# Patient Record
Sex: Male | Born: 1965 | Race: White | Hispanic: No | Marital: Single | Smoking: Current every day smoker
Health system: Southern US, Community
[De-identification: ages and names within clinical notes are randomized; demographics above are authoritative.]

## PROBLEM LIST (undated history)

## (undated) DIAGNOSIS — M1990 Unspecified osteoarthritis, unspecified site: Secondary | ICD-10-CM

## (undated) DIAGNOSIS — N289 Disorder of kidney and ureter, unspecified: Secondary | ICD-10-CM

## (undated) DIAGNOSIS — G459 Transient cerebral ischemic attack, unspecified: Secondary | ICD-10-CM

## (undated) HISTORY — PX: SKIN GRAFT: SHX250

---

## 2013-02-09 ENCOUNTER — Encounter (HOSPITAL_COMMUNITY): Payer: Self-pay | Admitting: Emergency Medicine

## 2013-02-09 ENCOUNTER — Emergency Department (HOSPITAL_COMMUNITY)
Admission: EM | Admit: 2013-02-09 | Discharge: 2013-02-09 | Disposition: A | Discharge status: Home / Self Care | Payer: BC Managed Care – PPO | Attending: Emergency Medicine | Admitting: Emergency Medicine

## 2013-02-09 ENCOUNTER — Emergency Department (HOSPITAL_COMMUNITY): Payer: BC Managed Care – PPO

## 2013-02-09 DIAGNOSIS — Z87442 Personal history of urinary calculi: Secondary | ICD-10-CM | POA: Insufficient documentation

## 2013-02-09 DIAGNOSIS — F17200 Nicotine dependence, unspecified, uncomplicated: Secondary | ICD-10-CM | POA: Insufficient documentation

## 2013-02-09 DIAGNOSIS — R29810 Facial weakness: Secondary | ICD-10-CM

## 2013-02-09 DIAGNOSIS — Z8739 Personal history of other diseases of the musculoskeletal system and connective tissue: Secondary | ICD-10-CM | POA: Insufficient documentation

## 2013-02-09 DIAGNOSIS — H02409 Unspecified ptosis of unspecified eyelid: Secondary | ICD-10-CM | POA: Insufficient documentation

## 2013-02-09 DIAGNOSIS — Z791 Long term (current) use of non-steroidal anti-inflammatories (NSAID): Secondary | ICD-10-CM | POA: Insufficient documentation

## 2013-02-09 HISTORY — DX: Disorder of kidney and ureter, unspecified: N28.9

## 2013-02-09 HISTORY — DX: Unspecified osteoarthritis, unspecified site: M19.90

## 2013-02-09 LAB — CBC WITH DIFFERENTIAL/PLATELET
Basophils Absolute: 0.1 10*3/uL (ref 0.0–0.1)
Basophils Relative: 2 % — ABNORMAL HIGH (ref 0–1)
Eosinophils Absolute: 0.1 10*3/uL (ref 0.0–0.7)
Eosinophils Relative: 1 % (ref 0–5)
HEMATOCRIT: 39.4 % (ref 39.0–52.0)
HEMOGLOBIN: 14.1 g/dL (ref 13.0–17.0)
LYMPHS PCT: 35 % (ref 12–46)
Lymphs Abs: 1.8 10*3/uL (ref 0.7–4.0)
MCH: 34.3 pg — ABNORMAL HIGH (ref 26.0–34.0)
MCHC: 35.8 g/dL (ref 30.0–36.0)
MCV: 95.9 fL (ref 78.0–100.0)
MONO ABS: 0.6 10*3/uL (ref 0.1–1.0)
MONOS PCT: 11 % (ref 3–12)
Neutro Abs: 2.7 10*3/uL (ref 1.7–7.7)
Neutrophils Relative %: 51 % (ref 43–77)
Platelets: 143 10*3/uL — ABNORMAL LOW (ref 150–400)
RBC: 4.11 MIL/uL — ABNORMAL LOW (ref 4.22–5.81)
RDW: 13.9 % (ref 11.5–15.5)
WBC: 5.2 10*3/uL (ref 4.0–10.5)

## 2013-02-09 LAB — BASIC METABOLIC PANEL
BUN: 11 mg/dL (ref 6–23)
CO2: 25 meq/L (ref 19–32)
CREATININE: 1.13 mg/dL (ref 0.50–1.35)
Calcium: 8.8 mg/dL (ref 8.4–10.5)
Chloride: 103 mEq/L (ref 96–112)
GFR calc Af Amer: 87 mL/min — ABNORMAL LOW (ref >90)
GFR calc non Af Amer: 75 mL/min — ABNORMAL LOW (ref >90)
Glucose, Bld: 83 mg/dL (ref 70–99)
Potassium: 3.9 mEq/L (ref 3.7–5.3)
Sodium: 139 mEq/L (ref 137–147)

## 2013-02-09 LAB — SEDIMENTATION RATE: SED RATE: 4 mm/h (ref 0–16)

## 2013-02-09 LAB — PROTIME-INR
INR: 1.15 (ref 0.00–1.49)
PROTHROMBIN TIME: 14.5 s (ref 11.6–15.2)

## 2013-02-09 LAB — APTT: aPTT: 33 seconds (ref 24–37)

## 2013-02-09 MED ORDER — HYPROMELLOSE (GONIOSCOPIC) 2.5 % OP SOLN: 1.0000 [drp] | Freq: Three times a day (TID) | OPHTHALMIC | Status: DC | PRN

## 2013-02-09 MED ORDER — IBUPROFEN 800 MG PO TABS
800.0000 mg | ORAL_TABLET | Freq: Once | ORAL | Status: AC
  Administered 2013-02-09: 800 mg via ORAL
  Filled 2013-02-09: qty 1

## 2013-02-09 MED ORDER — PREDNISONE 10 MG PO TABS: 10.0000 mg | ORAL_TABLET | Freq: Every day | ORAL | Status: DC

## 2013-02-09 NOTE — Discharge Instructions (Signed)
You were seen and evaluated for weakness of your left face and eye irritation. Your lab testing and CAT scan of your head have not shown any signs for a concerning or emergent cause of your symptoms at this time. Please followup with a neurology specialist for continued evaluation and treatment. Please also followup with primary care provider for continued evaluation and treatment. Return anytime for changing or worsening symptoms.

## 2013-02-09 NOTE — ED Provider Notes (Signed)
CSN: [id number]     Arrival date & time 02/09/13  0019 History   First MD Initiated Contact with Patient 02/09/13 0050     Chief Complaint  Patient presents with  . Eye Problem   HPI  History provided by the patient and wife. Patient is a 48 year old male with no significant PMH who presents with concerns for swelling of the left face and left eye irritation. Patient and wife state that he has had symptoms for the past one month. They report a slight drooping and weakness with the swelling appearance to his left lower eyelid and cheek area. Patient has also had some intermittent pains around the left temple and head. Also reports a tingling sensation occasionally to the left face. Symptoms do not always coincide. He does have some left eye irritation at times. Denies any redness to the eye or tearing. There has been no vision changes. He denies any speech change or slurred speech. No confusion. No other weakness or numbness in extremities. Denies any history of hypertension hypercholesterolemia or diabetes. Patient is at everyday smoker.    Past Medical History  Diagnosis Date  . Renal disorder     kidney stones  . Arthritis     both knees   History reviewed. No pertinent past surgical history. History reviewed. No pertinent family history. History  Substance Use Topics  . Smoking status: Current Every Day Smoker  . Smokeless tobacco: Not on file  . Alcohol Use: Yes    Review of Systems  Constitutional: Negative for fever, chills and diaphoresis.  Respiratory: Negative for cough and shortness of breath.   Neurological: Positive for facial asymmetry, numbness and headaches. Negative for dizziness, speech difficulty, weakness and light-headedness.  Psychiatric/Behavioral: Negative for confusion.  All other systems reviewed and are negative.    Allergies  Review of patient's allergies indicates no known allergies.  Home Medications   Current Outpatient Rx  Name  Route  Sig   Dispense  Refill  . meloxicam (MOBIC) 7.5 MG tablet   Oral   Take 7.5 mg by mouth 2 (two) times daily.          BP 124/80  Pulse 74  Temp(Src) 98 F (36.7 C) (Oral)  Resp 16  SpO2 98% Physical Exam  Nursing note and vitals reviewed. Constitutional: He is oriented to person, place, and time. He appears well-developed and well-nourished. No distress.  HENT:  Head: Normocephalic and atraumatic.  Mouth/Throat: Oropharynx is clear and moist.  Normal dentition. No swelling of the gums.  Eyes: Conjunctivae and EOM are normal. Pupils are equal, round, and reactive to light.  Neck: Normal range of motion. Neck supple. Carotid bruit is not present.  Cardiovascular: Normal rate and regular rhythm.   Pulmonary/Chest: Effort normal and breath sounds normal. No respiratory distress. He has no wheezes. He has no rales.  Abdominal: Soft.  Musculoskeletal: Normal range of motion. He exhibits no edema and no tenderness.  Neurological: He is alert and oriented to person, place, and time. He has normal strength. A cranial nerve deficit is present. No sensory deficit. Gait normal.  There appears to be some weakness to the left face with more prominence of the crease to the cheek and nose area as well as some increased decreasing under the left eyelid. Facial muscles seem weakened when palpated and visualized during smile and eye closure. He is able to close eye completely. There is no weakness to the forehead.  Skin: No rash noted. No erythema.  Psychiatric: He has a normal mood and affect. His behavior is normal.    ED Course  Procedures   DIAGNOSTIC STUDIES: Oxygen Saturation is 98% on room air.    COORDINATION OF CARE:  Nursing notes reviewed. Vital signs reviewed. Initial pt interview and examination performed.   3:18 AM-patient seen and evaluated. He is well-appearing no acute distress. Skin appears normal to the left face without induration or swelling. There does seem to be slight  weakness. This does not clearly appear consistent with Bell's palsy. Patient is slightly hypertensive at triage. He is also every day smoker. Discussed work up plan with pt at bedside, which includes testing and a CT of the head and brain. Pt agrees with plan.  Lab testing and head CT appear normal without concerning findings. Patient was discussed and evaluated with attending physician. At this time we will consult neurology for any further recommendations.  Neurology recommended performing a sedimentation rate.  Sedimentation rate is normal. At this point we will give patient referral for neurology and treat as possible Bell's palsy with prednisone and ophthalmic eyedrops   Results for orders placed during the hospital encounter of 02/09/13  CBC WITH DIFFERENTIAL      Result Value Range   WBC 5.2  4.0 - 10.5 K/uL   RBC 4.11 (*) 4.22 - 5.81 MIL/uL   Hemoglobin 14.1  13.0 - 17.0 g/dL   HCT 16.1  09.6 - 04.5 %   MCV 95.9  78.0 - 100.0 fL   MCH 34.3 (*) 26.0 - 34.0 pg   MCHC 35.8  30.0 - 36.0 g/dL   RDW 40.9  81.1 - 91.4 %   Platelets 143 (*) 150 - 400 K/uL   Neutrophils Relative % 51  43 - 77 %   Neutro Abs 2.7  1.7 - 7.7 K/uL   Lymphocytes Relative 35  12 - 46 %   Lymphs Abs 1.8  0.7 - 4.0 K/uL   Monocytes Relative 11  3 - 12 %   Monocytes Absolute 0.6  0.1 - 1.0 K/uL   Eosinophils Relative 1  0 - 5 %   Eosinophils Absolute 0.1  0.0 - 0.7 K/uL   Basophils Relative 2 (*) 0 - 1 %   Basophils Absolute 0.1  0.0 - 0.1 K/uL  BASIC METABOLIC PANEL      Result Value Range   Sodium 139  137 - 147 mEq/L   Potassium 3.9  3.7 - 5.3 mEq/L   Chloride 103  96 - 112 mEq/L   CO2 25  19 - 32 mEq/L   Glucose, Bld 83  70 - 99 mg/dL   BUN 11  6 - 23 mg/dL   Creatinine, Ser 7.82  0.50 - 1.35 mg/dL   Calcium 8.8  8.4 - 95.6 mg/dL   GFR calc non Af Amer 75 (*) >90 mL/min   GFR calc Af Amer 87 (*) >90 mL/min  PROTIME-INR      Result Value Range   Prothrombin Time 14.5  11.6 - 15.2 seconds   INR  1.15  0.00 - 1.49  APTT      Result Value Range   aPTT 33  24 - 37 seconds      Imaging Review Ct Head Wo Contrast  02/09/2013   CLINICAL DATA:  Left eye droop, visual disturbance, facial weakness  EXAM: CT HEAD WITHOUT CONTRAST  TECHNIQUE: Contiguous axial images were obtained from the base of the skull through the vertex without contrast.  COMPARISON:  None  FINDINGS: Normal appearance of the intracranial structures. No evidence for acute hemorrhage, mass lesion, midline shift, hydrocephalus or large infarct. No acute bony abnormality. The visualized sinuses are clear.  IMPRESSION: No acute intracranial abnormality.   Electronically Signed   By: Ruel Favorsrevor  Shick M.D.   On: 02/09/2013 02:51      MDM   1. Facial weakness        Angus Sellereter S Prescilla Monger, PA-C 02/09/13 2253

## 2013-02-09 NOTE — ED Provider Notes (Signed)
4:04 AM PT evaluated and he is now asymptomatic.  He does have a picture on his phone that he shows me that does demonstrate some facial asymmetry. He reports these symptoms come and go. He occasionally gets sharp severe pain on that side as well. No ear pain or drainage.  Speech clear, cranial nerves II through XII intact. No tenderness over TMJ or temporal artery. No tenderness or swelling around his left ear.  Case discussed with neurology Dr. Thad Rangereynolds. She recommends sedimentation rate, consider vasculitis versus migraine.  Can followup with neurology as an outpatient.  CT/ labs reviewed as below.  Results for orders placed during the hospital encounter of 02/09/13  CBC WITH DIFFERENTIAL      Result Value Range   WBC 5.2  4.0 - 10.5 K/uL   RBC 4.11 (*) 4.22 - 5.81 MIL/uL   Hemoglobin 14.1  13.0 - 17.0 g/dL   HCT 40.939.4  81.139.0 - 91.452.0 %   MCV 95.9  78.0 - 100.0 fL   MCH 34.3 (*) 26.0 - 34.0 pg   MCHC 35.8  30.0 - 36.0 g/dL   RDW 78.213.9  95.611.5 - 21.315.5 %   Platelets 143 (*) 150 - 400 K/uL   Neutrophils Relative % 51  43 - 77 %   Neutro Abs 2.7  1.7 - 7.7 K/uL   Lymphocytes Relative 35  12 - 46 %   Lymphs Abs 1.8  0.7 - 4.0 K/uL   Monocytes Relative 11  3 - 12 %   Monocytes Absolute 0.6  0.1 - 1.0 K/uL   Eosinophils Relative 1  0 - 5 %   Eosinophils Absolute 0.1  0.0 - 0.7 K/uL   Basophils Relative 2 (*) 0 - 1 %   Basophils Absolute 0.1  0.0 - 0.1 K/uL  BASIC METABOLIC PANEL      Result Value Range   Sodium 139  137 - 147 mEq/L   Potassium 3.9  3.7 - 5.3 mEq/L   Chloride 103  96 - 112 mEq/L   CO2 25  19 - 32 mEq/L   Glucose, Bld 83  70 - 99 mg/dL   BUN 11  6 - 23 mg/dL   Creatinine, Ser 0.861.13  0.50 - 1.35 mg/dL   Calcium 8.8  8.4 - 57.810.5 mg/dL   GFR calc non Af Amer 75 (*) >90 mL/min   GFR calc Af Amer 87 (*) >90 mL/min  PROTIME-INR      Result Value Range   Prothrombin Time 14.5  11.6 - 15.2 seconds   INR 1.15  0.00 - 1.49  APTT      Result Value Range   aPTT 33  24 - 37  seconds  SEDIMENTATION RATE      Result Value Range   Sed Rate 4  0 - 16 mm/hr   Ct Head Wo Contrast  02/09/2013   CLINICAL DATA:  Left eye droop, visual disturbance, facial weakness  EXAM: CT HEAD WITHOUT CONTRAST  TECHNIQUE: Contiguous axial images were obtained from the base of the skull through the vertex without contrast.  COMPARISON:  None  FINDINGS: Normal appearance of the intracranial structures. No evidence for acute hemorrhage, mass lesion, midline shift, hydrocephalus or large infarct. No acute bony abnormality. The visualized sinuses are clear.  IMPRESSION: No acute intracranial abnormality.   Electronically Signed   By: Ruel Favorsrevor  Shick M.D.   On: 02/09/2013 02:51    Medical screening examination/treatment/procedure(s) were conducted as a shared visit with non-physician practitioner(s)  and myself.  I personally evaluated the patient during the encounter.    Sunnie Nielsen, MD 02/09/13 [phone]

## 2013-02-09 NOTE — ED Notes (Signed)
Pt states that his left eye has been having a droop a month ago. Pt denies vision loss but states that the droop is getting worse.

## 2013-02-09 NOTE — ED Provider Notes (Signed)
Medical screening examination/treatment/procedure(s) were conducted as a shared visit with non-physician practitioner(s) and myself.  I personally evaluated the patient during the encounter.    Steve NielsenBrian Kashius Dominic, MD 02/09/13 [phone]

## 2013-03-13 ENCOUNTER — Emergency Department (HOSPITAL_COMMUNITY): Payer: BC Managed Care – PPO

## 2013-03-13 ENCOUNTER — Encounter (HOSPITAL_COMMUNITY): Payer: Self-pay | Admitting: Emergency Medicine

## 2013-03-13 ENCOUNTER — Inpatient Hospital Stay (HOSPITAL_COMMUNITY)
Admission: EM | Admit: 2013-03-13 | Discharge: 2013-03-14 | DRG: 069 | Disposition: A | Discharge status: Home / Self Care | Payer: BC Managed Care – PPO | Attending: Family Medicine | Admitting: Family Medicine

## 2013-03-13 DIAGNOSIS — M1990 Unspecified osteoarthritis, unspecified site: Secondary | ICD-10-CM

## 2013-03-13 DIAGNOSIS — E441 Mild protein-calorie malnutrition: Secondary | ICD-10-CM | POA: Diagnosis present

## 2013-03-13 DIAGNOSIS — Z87442 Personal history of urinary calculi: Secondary | ICD-10-CM

## 2013-03-13 DIAGNOSIS — D696 Thrombocytopenia, unspecified: Secondary | ICD-10-CM

## 2013-03-13 DIAGNOSIS — Z006 Encounter for examination for normal comparison and control in clinical research program: Secondary | ICD-10-CM

## 2013-03-13 DIAGNOSIS — N289 Disorder of kidney and ureter, unspecified: Secondary | ICD-10-CM | POA: Diagnosis present

## 2013-03-13 DIAGNOSIS — F17200 Nicotine dependence, unspecified, uncomplicated: Secondary | ICD-10-CM | POA: Diagnosis present

## 2013-03-13 DIAGNOSIS — Z79899 Other long term (current) drug therapy: Secondary | ICD-10-CM

## 2013-03-13 DIAGNOSIS — I4949 Other premature depolarization: Secondary | ICD-10-CM | POA: Diagnosis present

## 2013-03-13 DIAGNOSIS — W19XXXA Unspecified fall, initial encounter: Secondary | ICD-10-CM | POA: Diagnosis present

## 2013-03-13 DIAGNOSIS — I639 Cerebral infarction, unspecified: Secondary | ICD-10-CM

## 2013-03-13 DIAGNOSIS — G459 Transient cerebral ischemic attack, unspecified: Principal | ICD-10-CM | POA: Diagnosis present

## 2013-03-13 DIAGNOSIS — R471 Dysarthria and anarthria: Secondary | ICD-10-CM | POA: Diagnosis present

## 2013-03-13 DIAGNOSIS — IMO0002 Reserved for concepts with insufficient information to code with codable children: Secondary | ICD-10-CM

## 2013-03-13 DIAGNOSIS — M1710 Unilateral primary osteoarthritis, unspecified knee: Secondary | ICD-10-CM | POA: Diagnosis present

## 2013-03-13 DIAGNOSIS — R42 Dizziness and giddiness: Secondary | ICD-10-CM

## 2013-03-13 LAB — CBC
HEMATOCRIT: 38.3 % — AB (ref 39.0–52.0)
HEMOGLOBIN: 13.4 g/dL (ref 13.0–17.0)
MCH: 33.8 pg (ref 26.0–34.0)
MCHC: 35 g/dL (ref 30.0–36.0)
MCV: 96.5 fL (ref 78.0–100.0)
Platelets: 126 10*3/uL — ABNORMAL LOW (ref 150–400)
RBC: 3.97 MIL/uL — ABNORMAL LOW (ref 4.22–5.81)
RDW: 14.3 % (ref 11.5–15.5)
WBC: 5.5 10*3/uL (ref 4.0–10.5)

## 2013-03-13 LAB — URINALYSIS, ROUTINE W REFLEX MICROSCOPIC
Bilirubin Urine: NEGATIVE
Glucose, UA: NEGATIVE mg/dL
Ketones, ur: NEGATIVE mg/dL
Nitrite: NEGATIVE
PH: 7 (ref 5.0–8.0)
Protein, ur: NEGATIVE mg/dL
Specific Gravity, Urine: 1.007 (ref 1.005–1.030)
Urobilinogen, UA: 0.2 mg/dL (ref 0.0–1.0)

## 2013-03-13 LAB — URINE MICROSCOPIC-ADD ON

## 2013-03-13 LAB — COMPREHENSIVE METABOLIC PANEL
ALT: 18 U/L (ref 0–53)
AST: 25 U/L (ref 0–37)
Albumin: 2.9 g/dL — ABNORMAL LOW (ref 3.5–5.2)
Alkaline Phosphatase: 78 U/L (ref 39–117)
BUN: 12 mg/dL (ref 6–23)
CO2: 24 mEq/L (ref 19–32)
CREATININE: 0.94 mg/dL (ref 0.50–1.35)
Calcium: 8.5 mg/dL (ref 8.4–10.5)
Chloride: 105 mEq/L (ref 96–112)
GFR calc Af Amer: >90 mL/min (ref >90)
GFR calc non Af Amer: >90 mL/min (ref >90)
Glucose, Bld: 137 mg/dL — ABNORMAL HIGH (ref 70–99)
Potassium: 3.7 mEq/L (ref 3.7–5.3)
Sodium: 138 mEq/L (ref 137–147)
TOTAL PROTEIN: 5.8 g/dL — AB (ref 6.0–8.3)
Total Bilirubin: 1.1 mg/dL (ref 0.3–1.2)

## 2013-03-13 LAB — DIFFERENTIAL
BASOS PCT: 2 % — AB (ref 0–1)
Basophils Absolute: 0.1 10*3/uL (ref 0.0–0.1)
Eosinophils Absolute: 0.1 10*3/uL (ref 0.0–0.7)
Eosinophils Relative: 1 % (ref 0–5)
Lymphocytes Relative: 30 % (ref 12–46)
Lymphs Abs: 1.6 10*3/uL (ref 0.7–4.0)
MONO ABS: 0.5 10*3/uL (ref 0.1–1.0)
MONOS PCT: 8 % (ref 3–12)
NEUTROS PCT: 60 % (ref 43–77)
Neutro Abs: 3.3 10*3/uL (ref 1.7–7.7)

## 2013-03-13 LAB — I-STAT CHEM 8, ED
BUN: 11 mg/dL (ref 6–23)
CALCIUM ION: 1.18 mmol/L (ref 1.12–1.23)
CHLORIDE: 105 meq/L (ref 96–112)
Creatinine, Ser: 1 mg/dL (ref 0.50–1.35)
Glucose, Bld: 136 mg/dL — ABNORMAL HIGH (ref 70–99)
HEMATOCRIT: 39 % (ref 39.0–52.0)
Hemoglobin: 13.3 g/dL (ref 13.0–17.0)
POTASSIUM: 3.6 meq/L — AB (ref 3.7–5.3)
Sodium: 141 mEq/L (ref 137–147)
TCO2: 23 mmol/L (ref 0–100)

## 2013-03-13 LAB — RAPID URINE DRUG SCREEN, HOSP PERFORMED
Amphetamines: NOT DETECTED
BENZODIAZEPINES: NOT DETECTED
Barbiturates: NOT DETECTED
Cocaine: NOT DETECTED
OPIATES: NOT DETECTED
Tetrahydrocannabinol: NOT DETECTED

## 2013-03-13 LAB — I-STAT TROPONIN, ED: TROPONIN I, POC: 0 ng/mL (ref 0.00–0.08)

## 2013-03-13 LAB — APTT: aPTT: 32 seconds (ref 24–37)

## 2013-03-13 LAB — GLUCOSE, CAPILLARY: GLUCOSE-CAPILLARY: 112 mg/dL — AB (ref 70–99)

## 2013-03-13 LAB — PROTIME-INR
INR: 1.2 (ref 0.00–1.49)
Prothrombin Time: 14.9 seconds (ref 11.6–15.2)

## 2013-03-13 LAB — ETHANOL: Alcohol, Ethyl (B): <11 mg/dL (ref 0–11)

## 2013-03-13 MED ORDER — TRAMADOL HCL 50 MG PO TABS
50.0000 mg | ORAL_TABLET | Freq: Four times a day (QID) | ORAL | Status: DC | PRN
  Administered 2013-03-13: 50 mg via ORAL
  Filled 2013-03-13: qty 1

## 2013-03-13 MED ORDER — SODIUM CHLORIDE 0.9 % IJ SOLN
3.0000 mL | Freq: Two times a day (BID) | INTRAMUSCULAR | Status: DC
Start: 2013-03-13 — End: 2013-03-14
  Administered 2013-03-14: 3 mL via INTRAVENOUS

## 2013-03-13 MED ORDER — STUDY - INVESTIGATIONAL DRUG SIMPLE RECORD
90.0000 mg | Freq: Two times a day (BID) | Status: DC
  Administered 2013-03-14: 90 mg via ORAL
  Filled 2013-03-13 (×2): qty 90

## 2013-03-13 MED ORDER — STUDY - INVESTIGATIONAL DRUG SIMPLE RECORD
180.0000 mg | Freq: Once | Status: AC
  Administered 2013-03-13: 180 mg via ORAL
  Filled 2013-03-13: qty 180

## 2013-03-13 MED ORDER — SODIUM CHLORIDE 0.9 % IJ SOLN
3.0000 mL | INTRAMUSCULAR | Status: DC | PRN
  Administered 2013-03-14 (×2): 3 mL via INTRAVENOUS

## 2013-03-13 MED ORDER — STUDY - INVESTIGATIONAL DRUG SIMPLE RECORD
300.0000 mg | Freq: Once | Status: AC
  Administered 2013-03-13: 300 mg via ORAL
  Filled 2013-03-13: qty 300

## 2013-03-13 MED ORDER — STUDY - INVESTIGATIONAL DRUG SIMPLE RECORD
100.0000 mg | Freq: Every day | Status: DC
  Administered 2013-03-14: 100 mg via ORAL
  Filled 2013-03-13 (×2): qty 100

## 2013-03-13 MED ORDER — SODIUM CHLORIDE 0.9 % IV SOLN: 250.0000 mL | INTRAVENOUS | Status: DC | PRN

## 2013-03-13 NOTE — ED Notes (Signed)
Per pt was at work this am and had sudden onset of dizziness. sts he had a fall at work because he was dizzy. Pt sts that the right side of his face feels funny and family sts he has been talking funny and stumbling.

## 2013-03-13 NOTE — ED Notes (Signed)
Patient in MRI now.

## 2013-03-13 NOTE — Consult Note (Signed)
Referring Physician: Deretha Emory    Chief Complaint: dysarthria (code stroke)  HPI:                                                                                                                                         Steve Cook is an 48 y.o. male who awoke at 4 am feeling fine.  His wife noted he mentioned he felt unstable on his feet around 6:30.  As the morning continued she noted his speech was slightly slurred.  At 7 AM he went home to change and felt very off balance to the point he fell.  He feels he fell to the right. Later he called his wife stating he had a slight HA and did not feel well. Patient was brought to ED as a code stroke due to his dysarthria. Currently he continues to have some dysarthria and states his left forehead feels more sensitive than his right but his right lower face also feels more sensitive than his left.   Of note: He was here in the hospital on the 29th of January.  At that time he was complaining of left eye pain and left eye swelling.  Neurology was spoken to at that time and it was recommended a SED rate be obtained.  This was and returned WNL.   Date last known well: Date: 03/13/2013 Time last known well: Time: 06:30 tPA Given: No: out of IV tPA window NIHSS: 3  Past Medical History  Diagnosis Date  . Renal disorder     kidney stones  . Arthritis     both knees    History reviewed. No pertinent past surgical history.  History reviewed. No pertinent family history. Social History:  reports that he has been smoking.  He does not have any smokeless tobacco history on file. He reports that he drinks alcohol. He reports that he does not use illicit drugs.  Allergies: No Known Allergies  Medications:                                                                                                                           No current facility-administered medications for this encounter.   Current Outpatient Prescriptions  Medication Sig Dispense  Refill  . hydroxypropyl methylcellulose (ISOPTO TEARS) 2.5 % ophthalmic solution Place 1 drop into the left eye 3 (three) times daily as needed  for dry eyes.  15 mL  12  . meloxicam (MOBIC) 7.5 MG tablet Take 7.5 mg by mouth 2 (two) times daily.      . predniSONE (DELTASONE) 10 MG tablet Take 1 tablet (10 mg total) by mouth daily. Take 6 tablets on day one, take 5 tablets on day 2, take 4 tabs on day 3, take 3 tabs on day 4, take 2 tabs on day 5, take one tablet on day 6  15 tablet  0     ROS:                                                                                                                                       History obtained from the patient  General ROS: negative for - chills, fatigue, fever, night sweats, weight gain or weight loss Psychological ROS: negative for - behavioral disorder, hallucinations, memory difficulties, mood swings or suicidal ideation Ophthalmic ROS: negative for - blurry vision, double vision, eye pain or loss of vision ENT ROS: negative for - epistaxis, nasal discharge, oral lesions, sore throat, tinnitus or vertigo Allergy and Immunology ROS: negative for - hives or itchy/watery eyes Hematological and Lymphatic ROS: negative for - bleeding problems, bruising or swollen lymph nodes Endocrine ROS: negative for - galactorrhea, hair pattern changes, polydipsia/polyuria or temperature intolerance Respiratory ROS: negative for - cough, hemoptysis, shortness of breath or wheezing Cardiovascular ROS: negative for - chest pain, dyspnea on exertion, edema or irregular heartbeat Gastrointestinal ROS: negative for - abdominal pain, diarrhea, hematemesis, nausea/vomiting or stool incontinence Genito-Urinary ROS: negative for - dysuria, hematuria, incontinence or urinary frequency/urgency Musculoskeletal ROS: negative for - joint swelling or muscular weakness Neurological ROS: as noted in HPI Dermatological ROS: negative for rash and skin lesion  changes  Neurologic Examination:                                                                                                      Blood pressure 156/71, pulse 83, temperature 98.3 F (36.8 C), resp. rate 18, SpO2 98.00%.   Mental Status: Alert, oriented, thought content appropriate.  Speech dysarthric without evidence of aphasia.  Able to follow 3 step commands without difficulty. Cranial Nerves: II: Discs flat bilaterally; Visual fields grossly normal, pupils equal, round, reactive to light and accommodation III,IV, VI: ptosis not present, extra-ocular motions intact bilaterally V,VII: smile symmetric, facial light touch sensation decreased on the right forehead and decreased on the left lower face.  VIII: hearing normal bilaterally IX,X:  gag reflex present XI: bilateral shoulder shrug XII: midline tongue extension without atrophy or fasciculations  Motor: Right : Upper extremity   5/5    Left:     Upper extremity   5/5  Lower extremity   5/5     Lower extremity   5/5 Tone and bulk:normal tone throughout; no atrophy noted Sensory: Pinprick and light touch intact throughout, bilaterally Deep Tendon Reflexes:  Right: Upper Extremity   Left: Upper extremity   biceps (C-5 to C-6) 2/4   biceps (C-5 to C-6) 2/4 tricep (C7) 2/4    triceps (C7) 2/4 Brachioradialis (C6) 2/4  Brachioradialis (C6) 2/4  Lower Extremity Lower Extremity  quadriceps (L-2 to L-4) 2/4   quadriceps (L-2 to L-4) 2/4 Achilles (S1) 2/4   Achilles (S1) 2/4  Plantars: Right: downgoing   Left: downgoing Cerebellar: normal finger-to-nose,  normal heel-to-shin test Gait: not tested due to multiple leads.  CV: pulses palpable throughout    Lab Results: Basic Metabolic Panel:  Recent Labs Lab 03/13/13 1242  NA 141  K 3.6*  CL 105  GLUCOSE 136*  BUN 11  CREATININE 1.00    Liver Function Tests: No results found for this basename: AST, ALT, ALKPHOS, BILITOT, PROT, ALBUMIN,  in the last 168 hours No  results found for this basename: LIPASE, AMYLASE,  in the last 168 hours No results found for this basename: AMMONIA,  in the last 168 hours  CBC:  Recent Labs Lab 03/13/13 1242  HGB 13.3  HCT 39.0    Cardiac Enzymes: No results found for this basename: CKTOTAL, CKMB, CKMBINDEX, TROPONINI,  in the last 168 hours  Lipid Panel: No results found for this basename: CHOL, TRIG, HDL, CHOLHDL, VLDL, LDLCALC,  in the last 168 hours  CBG: No results found for this basename: GLUCAP,  in the last 168 hours  Microbiology: No results found for this or any previous visit.  Coagulation Studies: No results found for this basename: LABPROT, INR,  in the last 72 hours  Imaging: No results found.     Assessment and plan discussed with with attending physician and they are in agreement.    Felicie MornDavid Smith PA-C [facility] [phone]  03/13/2013, 12:47 PM   Assessment: 48 y.o. male with new onset dysarthria and decreased facial sensation.  Patient is out of the window for tPA and NIHSS is 3 due to dysarthria and decreased facial sensation.  At this time cannot rule out posterior circulationTIA versus CVA.  Patient has gone to MRI at present time.    Stroke Risk Factors - none  Recommend--TIA workup    1. HgbA1c, fasting lipid panel 2. MRI, MRA  of the brain without contrast 3. PT consult, OT consult, Speech consult 4. Echocardiogram 5. Carotid dopplers 6. Prophylactic therapy-Antiplatelet med: Aspirin - dose 81 7. Risk  Factor modification   Patient seen and examined together with physician assistant and I concur with the assessment and plan.  Wyatt Portelasvaldo Camilo, MD

## 2013-03-13 NOTE — Progress Notes (Signed)
MD paged that patient has arrived on the unit.

## 2013-03-13 NOTE — H&P (Signed)
Family Medicine Teaching [facility] Admission History and Physical Service Pager: [phone]  Patient name: Steve Cook Medical record number: [id number] Date of birth: 1965/09/04 Age: 48 y.o. Gender: male  Primary Care Provider: No PCP Per Patient Consultants: Neurology Code Status: Full  Chief Complaint: Dizzy, fall  Assessment and Plan: Steve Cook is a 48 y.o. male presenting with dizziness and fall. PMH is significant for kidney stones, knee arthritis, history of dizzy spells and facial swelling.  # TIA / Dizziness: code stroke called in ED, seen by neuro and deemed not a candidate for TPA. CT head and MRI/MRA brain negative for acute stroke. EKG with occasional narrow PVCs. Lab workup: Cmet with low protein 5.8, albumin 2.9; CBC wnl, INR 1.20, normal ethanol, normal i-stat troponin, UA trace hemoglobin and leukocytes, negative UDS. Patient currently with normal speech (though fiancee does most of history), exam significant for decreased sensation of right face/arm/leg and stumbled to right while doing heel-to-toe.  - admit to telemetry, attending Dr. Mauricio Po - complete TIA workup including echo, carotid doppler, lipid panel, A1c - enrolled in stroke clinical trial in ED: patient to continue with aspirin vs ticagrelor for antiplatelet (Neuro was recommending 81mg  ASA) - neuro checks q4hrs - diet NPO pending bedside swallow eval, if passes, then heart healthy - PT/OT evals  # Bilateral knee arthritis: takes meloxicam twice a day. While enrolled in clinical trial will need to discontinue this. - stop meloxicam - tramadol bid PRN, if not tolerating then will restart meloxicam and trial will likely change / discontinue medications.  # Mild thrombocytopenia - PLT count 126; was 143 in January. No signs of bruising or bleeding. - Monitor  FEN/GI: NPO pending bedside swallow, saline lock IV Prophylaxis: SCDs  Disposition: admit to tele  History of Present Illness: Steve Cook  is a 48 y.o. male presenting with dizziness and fall this morning. This morning had sensation like he was "going to hit floor", and did fall today to the right but was able to catch himself; did not strike his head or hurt anything. He has never fallen before. Today is the worst his dizziness has ever been. [name] noted this morning that his voice sounded weak/muffled, did not notice any facial droop or swelling. At 7:15am got out of car and stumbled. At 9:45-10am called fiancee and said he felt drunk and fell. Also complained of headache and numbness/tingling of left hand earlier while in ED that has resolved. Denies weakness anywhere, changes in vision / hearing, or changes in speech.  Previously seen at ED about 1 month ago for left sided facial swelling (had CT head at that time).  Fiance thinks he has been having mini strokes: "lazy with speech", doesn't want to talk, voice is "draggy", gets tired. Fiance reports episodes that last a few hours since November. Patient reports that he has had dizzy spells for the past 10 years.  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 Social stressors include going through 30 year divorce, ex-wife aggravates him often.  Takes meloxicam twice a day for arthritis.  Review Of Systems: Per HPI with the following additions: denies CP, SOB, pain elsewhere.  Otherwise 12 point review of systems was performed and was unremarkable.  Patient Active Problem List   Diagnosis Date Noted  . TIA (transient ischemic attack) 03/13/2013   Past Medical History: Past Medical History  Diagnosis Date  . Renal disorder     kidney stones  . Arthritis  both knees   Past Surgical History: History reviewed. No pertinent past surgical history. Social History: History  Substance Use Topics  . Smoking status: Current Every Day Smoker  . Smokeless tobacco: Not on file  . Alcohol Use: Yes  Reports 1 peach schnopps drink every week or  two.  Additional social history: lives with fiance (and her 25yo son).  Please also refer to relevant sections of EMR.  Family History: History reviewed. No pertinent family history. Allergies and Medications: No Known Allergies No current facility-administered medications on file prior to encounter.   Current Outpatient Prescriptions on File Prior to Encounter  Medication Sig Dispense Refill  . meloxicam (MOBIC) 7.5 MG tablet Take 7.5 mg by mouth 2 (two) times daily.        Objective: BP 142/99  Pulse 86  Temp(Src) 97.8 F (36.6 C)  Resp 19  SpO2 100% Exam: General: NAD HEENT: PERRL, EOMI, o/p mildly erythematous but clear, sclera clear Cardiovascular: RRR, occasional skipped PVC, normal heart sounds. 2+ radial, DP, PT pulses bilaterally. Respiratory: CTAB, normal effort Abdomen: soft, NTND  Rectal exam: patient deferred Extremities: 2+ pitting edema bilaterally up to mid-shin.  Skin: warm and dry, well healed graft on left upper arm Neuro: alert and oriented. CN2-12 normal except for sensation--> Decreased sensation to light touch of right face and right UE (reports bilateral decreased sensation of legs after back injury). Strength 5/5 upper and lower extremities bilaterally. Reflexes 2+ biceps, patellar and achilles bilaterally. 1-2 beat clonus in feet bilaterally. Gait: slightly unsteady with normal appearance of walking; while doing heel to toe displays imbalance to right  Labs and Imaging: CBC BMET   Recent Labs Lab 03/13/13 1232 03/13/13 1242  WBC 5.5  --   HGB 13.4 13.3  HCT 38.3* 39.0  PLT 126*  --     Recent Labs Lab 03/13/13 1232 03/13/13 1242  NA 138 141  K 3.7 3.6*  CL 105 105  CO2 24  --   BUN 12 11  CREATININE 0.94 1.00  GLUCOSE 137* 136*  CALCIUM 8.5  --       Ct Head Wo Contrast  03/13/2013   CLINICAL DATA:  Code stroke, right-sided weakness starting 7 a.m. today  EXAM: CT HEAD WITHOUT CONTRAST  TECHNIQUE: Contiguous axial images were  obtained from the base of the skull through the vertex without intravenous contrast.  COMPARISON:  None.  FINDINGS: No mass lesion. No midline shift. No acute hemorrhage or hematoma. No extra-axial fluid collections. No evidence of acute infarction.   IMPRESSION: Negative head CT  Critical Value/emergent results were called by telephone at the time of interpretation on 03/13/2013 at 12:47 PM to Dr. Cyril Mourning , who verbally acknowledged these results.   Electronically Signed   By: Esperanza Heir M.D.   On: 03/13/2013 12:47   Mr Maxine Glenn Head Wo Contrast  03/13/2013   CLINICAL DATA:  Dysarthria and right-sided weakness. Evaluate for stroke.  EXAM: MRI HEAD WITHOUT CONTRAST  MRA HEAD WITHOUT CONTRAST  TECHNIQUE: Multiplanar, multiecho pulse sequences of the brain and surrounding structures were obtained without intravenous contrast. Angiographic images of the head were obtained using MRA technique without contrast.  COMPARISON:  Head CT 03/13/2013  FINDINGS: MRI HEAD FINDINGS  There is no evidence of acute infarct or intracranial hemorrhage. Ventricles and sulci are within normal limits for age. There is no evidence of mass, midline shift, or extra-axial fluid collection. Major intracranial vascular flow voids are preserved. Orbits are unremarkable. Paranasal sinuses and  mastoid air cells are clear.  MRA HEAD FINDINGS  The visualized distal vertebral arteries are patent with the left being minimally larger than the right. PICA origins are patent. AICAs are patent. SCA origins are patent. PCAs are unremarkable. There is a patent left posterior communicating artery. Internal carotid arteries are patent from skullbase to carotid termini. Carotid siphons are mildly ectatic, right more so than left. ACAs and MCAs are unremarkable. Anterior communicating artery is patent. No intracranial aneurysm.   IMPRESSION: 1. Unremarkable appearance of the brain.  No evidence of stroke. 2. Mildly ectatic carotid siphons, otherwise  unremarkable head MRA.   Electronically Signed   By: Sebastian AcheAllen  Grady   On: 03/13/2013 13:58   Mr Brain Wo Contrast  03/13/2013   CLINICAL DATA:  Dysarthria and right-sided weakness. Evaluate for stroke.  EXAM: MRI HEAD WITHOUT CONTRAST  MRA HEAD WITHOUT CONTRAST  TECHNIQUE: Multiplanar, multiecho pulse sequences of the brain and surrounding structures were obtained without intravenous contrast. Angiographic images of the head were obtained using MRA technique without contrast.  COMPARISON:  Head CT 03/13/2013  FINDINGS: MRI HEAD FINDINGS  There is no evidence of acute infarct or intracranial hemorrhage. Ventricles and sulci are within normal limits for age. There is no evidence of mass, midline shift, or extra-axial fluid collection. Major intracranial vascular flow voids are preserved. Orbits are unremarkable. Paranasal sinuses and mastoid air cells are clear.  MRA HEAD FINDINGS  The visualized distal vertebral arteries are patent with the left being minimally larger than the right. PICA origins are patent. AICAs are patent. SCA origins are patent. PCAs are unremarkable. There is a patent left posterior communicating artery. Internal carotid arteries are patent from skullbase to carotid termini. Carotid siphons are mildly ectatic, right more so than left. ACAs and MCAs are unremarkable. Anterior communicating artery is patent. No intracranial aneurysm.  IMPRESSION: 1. Unremarkable appearance of the brain.  No evidence of stroke. 2. Mildly ectatic carotid siphons, otherwise unremarkable head MRA.   Electronically Signed   By: Sebastian AcheAllen  Grady   On: 03/13/2013 13:58    Tawni CarnesAndrew Wight, MD 03/13/2013, 3:28 PM PGY-1, [facility] FPTS Intern pager: [phone], text pages welcome   I have seen patient with Dr Waynetta SandyWight and agree with his assessment and plan with my additions in blue.  Leona SingletonMaria T Nataniel Gasper, MD PGY-2, [facility]

## 2013-03-13 NOTE — ED Notes (Signed)
CBG 146 mg/DL

## 2013-03-13 NOTE — ED Provider Notes (Signed)
CSN: [id number]     Arrival date & time 03/13/13  1210 History   First MD Initiated Contact with Patient 03/13/13 1223     Chief Complaint  Patient presents with  . Code Stroke    An emergency department physician performed an initial assessment on this suspected stroke patient at 10. (Consider location/radiation/quality/duration/timing/severity/associated sxs/prior Treatment) The history is provided by the patient.   48 year old male who awoke at 4 AM feeling fine. At 6:30 things were abnormal. Patient felt unstable on his feet his wife noted that his speech was slightly slurred. At 7 AM he was changing and he felt very off balance to the point that he fell. He feels he fell to the right. Later he called his wife stating he had a slight headache and was not feeling well. Patient was brought to the ED as a code stroke due to the dysarthria. Patient continues to have some dysarthria states his left fore head feels more sensitive than his right.  Past Medical History  Diagnosis Date  . Renal disorder     kidney stones  . Arthritis     both knees   History reviewed. No pertinent past surgical history. History reviewed. No pertinent family history. History  Substance Use Topics  . Smoking status: Current Every Day Smoker  . Smokeless tobacco: Not on file  . Alcohol Use: Yes    Review of Systems  Constitutional: Negative for fever.  HENT: Positive for facial swelling. Negative for congestion, drooling, sore throat and trouble swallowing.   Eyes: Negative for visual disturbance.  Respiratory: Negative for shortness of breath.   Cardiovascular: Negative for chest pain.  Gastrointestinal: Negative for abdominal pain.  Genitourinary: Negative for dysuria.  Musculoskeletal: Negative for back pain.  Skin: Negative for rash.  Neurological: Positive for dizziness, speech difficulty, numbness and headaches. Negative for syncope.  Hematological: Does not bruise/bleed easily.   Psychiatric/Behavioral: Negative for confusion.      Allergies  Review of patient's allergies indicates no known allergies.  Home Medications   Current Outpatient Rx  Name  Route  Sig  Dispense  Refill  . hydroxypropyl methylcellulose (ISOPTO TEARS) 2.5 % ophthalmic solution   Left Eye   Place 1 drop into the left eye 3 (three) times daily as needed for dry eyes.   15 mL   12   . meloxicam (MOBIC) 7.5 MG tablet   Oral   Take 7.5 mg by mouth 2 (two) times daily.         . predniSONE (DELTASONE) 10 MG tablet   Oral   Take 1 tablet (10 mg total) by mouth daily. Take 6 tablets on day one, take 5 tablets on day 2, take 4 tabs on day 3, take 3 tabs on day 4, take 2 tabs on day 5, take one tablet on day 6   15 tablet   0    BP 131/79  Pulse 83  Temp(Src) 97.8 F (36.6 C)  Resp 17  SpO2 99% Physical Exam  Nursing note and vitals reviewed. Constitutional: He is oriented to person, place, and time. He appears well-developed and well-nourished. No distress.  HENT:  Head: Normocephalic and atraumatic.  Mouth/Throat: Oropharynx is clear and moist.  Eyes: Conjunctivae and EOM are normal. Pupils are equal, round, and reactive to light.  Neck: Normal range of motion.  Cardiovascular: Normal rate, regular rhythm and normal heart sounds.   No murmur heard. Pulmonary/Chest: Effort normal and breath sounds normal. No respiratory distress.  Abdominal: Soft. Bowel sounds are normal. There is no tenderness.  Musculoskeletal: Normal range of motion. He exhibits no edema.  Neurological: He is alert and oriented to person, place, and time. No cranial nerve deficit. He exhibits normal muscle tone. Coordination normal.  Normal except for the following. Speech is arthritic without evidence of aphasia. Able to follow 3 step commands without difficulty. Smile is symmetric facial light touch sensation decreased on the right for head decreased on the left lower face. Motor strength left and right  is normal. Sensory pinprick light touch is intact throughout bilaterally.  Skin: Skin is warm. No rash noted.    ED Course  Procedures (including critical care time) Labs Review Labs Reviewed  CBC - Abnormal; Notable for the following:    RBC 3.97 (*)    HCT 38.3 (*)    Platelets 126 (*)    All other components within normal limits  DIFFERENTIAL - Abnormal; Notable for the following:    Basophils Relative 2 (*)    All other components within normal limits  COMPREHENSIVE METABOLIC PANEL - Abnormal; Notable for the following:    Glucose, Bld 137 (*)    Total Protein 5.8 (*)    Albumin 2.9 (*)    All other components within normal limits  URINALYSIS, ROUTINE W REFLEX MICROSCOPIC - Abnormal; Notable for the following:    Hgb urine dipstick TRACE (*)    Leukocytes, UA TRACE (*)    All other components within normal limits  I-STAT CHEM 8, ED - Abnormal; Notable for the following:    Potassium 3.6 (*)    Glucose, Bld 136 (*)    All other components within normal limits  ETHANOL  PROTIME-INR  APTT  URINE RAPID DRUG SCREEN (HOSP PERFORMED)  URINE MICROSCOPIC-ADD ON  I-STAT TROPOININ, ED  I-STAT TROPOININ, ED   Results for orders placed during the hospital encounter of 03/13/13  ETHANOL      Result Value Ref Range   Alcohol, Ethyl (B) <11  0 - 11 mg/dL  PROTIME-INR      Result Value Ref Range   Prothrombin Time 14.9  11.6 - 15.2 seconds   INR 1.20  0.00 - 1.49  APTT      Result Value Ref Range   aPTT 32  24 - 37 seconds  CBC      Result Value Ref Range   WBC 5.5  4.0 - 10.5 K/uL   RBC 3.97 (*) 4.22 - 5.81 MIL/uL   Hemoglobin 13.4  13.0 - 17.0 g/dL   HCT 16.1 (*) 09.6 - 04.5 %   MCV 96.5  78.0 - 100.0 fL   MCH 33.8  26.0 - 34.0 pg   MCHC 35.0  30.0 - 36.0 g/dL   RDW 40.9  81.1 - 91.4 %   Platelets 126 (*) 150 - 400 K/uL  DIFFERENTIAL      Result Value Ref Range   Neutrophils Relative % 60  43 - 77 %   Neutro Abs 3.3  1.7 - 7.7 K/uL   Lymphocytes Relative 30  12 -  46 %   Lymphs Abs 1.6  0.7 - 4.0 K/uL   Monocytes Relative 8  3 - 12 %   Monocytes Absolute 0.5  0.1 - 1.0 K/uL   Eosinophils Relative 1  0 - 5 %   Eosinophils Absolute 0.1  0.0 - 0.7 K/uL   Basophils Relative 2 (*) 0 - 1 %   Basophils Absolute 0.1  0.0 -  0.1 K/uL  COMPREHENSIVE METABOLIC PANEL      Result Value Ref Range   Sodium 138  137 - 147 mEq/L   Potassium 3.7  3.7 - 5.3 mEq/L   Chloride 105  96 - 112 mEq/L   CO2 24  19 - 32 mEq/L   Glucose, Bld 137 (*) 70 - 99 mg/dL   BUN 12  6 - 23 mg/dL   Creatinine, Ser 4.09  0.50 - 1.35 mg/dL   Calcium 8.5  8.4 - 81.1 mg/dL   Total Protein 5.8 (*) 6.0 - 8.3 g/dL   Albumin 2.9 (*) 3.5 - 5.2 g/dL   AST 25  0 - 37 U/L   ALT 18  0 - 53 U/L   Alkaline Phosphatase 78  39 - 117 U/L   Total Bilirubin 1.1  0.3 - 1.2 mg/dL   GFR calc non Af Amer >90  >90 mL/min   GFR calc Af Amer >90  >90 mL/min  URINE RAPID DRUG SCREEN (HOSP PERFORMED)      Result Value Ref Range   Opiates NONE DETECTED  NONE DETECTED   Cocaine NONE DETECTED  NONE DETECTED   Benzodiazepines NONE DETECTED  NONE DETECTED   Amphetamines NONE DETECTED  NONE DETECTED   Tetrahydrocannabinol NONE DETECTED  NONE DETECTED   Barbiturates NONE DETECTED  NONE DETECTED  URINALYSIS, ROUTINE W REFLEX MICROSCOPIC      Result Value Ref Range   Color, Urine YELLOW  YELLOW   APPearance CLEAR  CLEAR   Specific Gravity, Urine 1.007  1.005 - 1.030   pH 7.0  5.0 - 8.0   Glucose, UA NEGATIVE  NEGATIVE mg/dL   Hgb urine dipstick TRACE (*) NEGATIVE   Bilirubin Urine NEGATIVE  NEGATIVE   Ketones, ur NEGATIVE  NEGATIVE mg/dL   Protein, ur NEGATIVE  NEGATIVE mg/dL   Urobilinogen, UA 0.2  0.0 - 1.0 mg/dL   Nitrite NEGATIVE  NEGATIVE   Leukocytes, UA TRACE (*) NEGATIVE  URINE MICROSCOPIC-ADD ON      Result Value Ref Range   Squamous Epithelial / LPF RARE  RARE   WBC, UA 0-2  <3 WBC/hpf   RBC / HPF 0-2  <3 RBC/hpf   Bacteria, UA RARE  RARE  I-STAT CHEM 8, ED      Result Value Ref Range    Sodium 141  137 - 147 mEq/L   Potassium 3.6 (*) 3.7 - 5.3 mEq/L   Chloride 105  96 - 112 mEq/L   BUN 11  6 - 23 mg/dL   Creatinine, Ser 9.14  0.50 - 1.35 mg/dL   Glucose, Bld 782 (*) 70 - 99 mg/dL   Calcium, Ion 9.56  2.13 - 1.23 mmol/L   TCO2 23  0 - 100 mmol/L   Hemoglobin 13.3  13.0 - 17.0 g/dL   HCT 08.6  57.8 - 46.9 %  I-STAT TROPOININ, ED      Result Value Ref Range   Troponin i, poc 0.00  0.00 - 0.08 ng/mL   Comment 3             Imaging Review Ct Head Wo Contrast  03/13/2013   CLINICAL DATA:  Code stroke, right-sided weakness starting 7 a.m. today  EXAM: CT HEAD WITHOUT CONTRAST  TECHNIQUE: Contiguous axial images were obtained from the base of the skull through the vertex without intravenous contrast.  COMPARISON:  None.  FINDINGS: No mass lesion. No midline shift. No acute hemorrhage or hematoma. No extra-axial fluid collections.  No evidence of acute infarction.  IMPRESSION: Negative head CT  Critical Value/emergent results were called by telephone at the time of interpretation on 03/13/2013 at 12:47 PM to Dr. Cyril Mourningamillo , who verbally acknowledged these results.   Electronically Signed   By: Esperanza Heiraymond  Rubner M.D.   On: 03/13/2013 12:47   Mr Maxine GlennMra Head Wo Contrast  03/13/2013   CLINICAL DATA:  Dysarthria and right-sided weakness. Evaluate for stroke.  EXAM: MRI HEAD WITHOUT CONTRAST  MRA HEAD WITHOUT CONTRAST  TECHNIQUE: Multiplanar, multiecho pulse sequences of the brain and surrounding structures were obtained without intravenous contrast. Angiographic images of the head were obtained using MRA technique without contrast.  COMPARISON:  Head CT 03/13/2013  FINDINGS: MRI HEAD FINDINGS  There is no evidence of acute infarct or intracranial hemorrhage. Ventricles and sulci are within normal limits for age. There is no evidence of mass, midline shift, or extra-axial fluid collection. Major intracranial vascular flow voids are preserved. Orbits are unremarkable. Paranasal sinuses and mastoid air  cells are clear.  MRA HEAD FINDINGS  The visualized distal vertebral arteries are patent with the left being minimally larger than the right. PICA origins are patent. AICAs are patent. SCA origins are patent. PCAs are unremarkable. There is a patent left posterior communicating artery. Internal carotid arteries are patent from skullbase to carotid termini. Carotid siphons are mildly ectatic, right more so than left. ACAs and MCAs are unremarkable. Anterior communicating artery is patent. No intracranial aneurysm.  IMPRESSION: 1. Unremarkable appearance of the brain.  No evidence of stroke. 2. Mildly ectatic carotid siphons, otherwise unremarkable head MRA.   Electronically Signed   By: Sebastian AcheAllen  Grady   On: 03/13/2013 13:58   Mr Brain Wo Contrast  03/13/2013   CLINICAL DATA:  Dysarthria and right-sided weakness. Evaluate for stroke.  EXAM: MRI HEAD WITHOUT CONTRAST  MRA HEAD WITHOUT CONTRAST  TECHNIQUE: Multiplanar, multiecho pulse sequences of the brain and surrounding structures were obtained without intravenous contrast. Angiographic images of the head were obtained using MRA technique without contrast.  COMPARISON:  Head CT 03/13/2013  FINDINGS: MRI HEAD FINDINGS  There is no evidence of acute infarct or intracranial hemorrhage. Ventricles and sulci are within normal limits for age. There is no evidence of mass, midline shift, or extra-axial fluid collection. Major intracranial vascular flow voids are preserved. Orbits are unremarkable. Paranasal sinuses and mastoid air cells are clear.  MRA HEAD FINDINGS  The visualized distal vertebral arteries are patent with the left being minimally larger than the right. PICA origins are patent. AICAs are patent. SCA origins are patent. PCAs are unremarkable. There is a patent left posterior communicating artery. Internal carotid arteries are patent from skullbase to carotid termini. Carotid siphons are mildly ectatic, right more so than left. ACAs and MCAs are unremarkable.  Anterior communicating artery is patent. No intracranial aneurysm.  IMPRESSION: 1. Unremarkable appearance of the brain.  No evidence of stroke. 2. Mildly ectatic carotid siphons, otherwise unremarkable head MRA.   Electronically Signed   By: Sebastian AcheAllen  Grady   On: 03/13/2013 13:58     EKG Interpretation   Date/Time:  Monday March 13 2013 12:47:07 EST Ventricular Rate:  87 PR Interval:  147 QRS Duration: 101 QT Interval:  395 QTC Calculation: 475 R Axis:   30 Text Interpretation:  Sinus rhythm Ventricular premature complex RSR' in  V1 or V2, right VCD or RVH Borderline prolonged QT interval No previous  ECGs available Confirmed by Danese Dorsainvil  MD, Symiah Nowotny [phone] on 03/13/2013  1:08:36 PM       CRITICAL CARE Performed by: Shelda Jakes. Total critical care time: 30 Critical care time was exclusive of separately billable procedures and treating other patients. Critical care was necessary to treat or prevent imminent or life-threatening deterioration. Critical care was time spent personally by me on the following activities: development of treatment plan with patient and/or surrogate as well as nursing, discussions with consultants, evaluation of patient's response to treatment, examination of patient, obtaining history from patient or surrogate, ordering and performing treatments and interventions, ordering and review of laboratory studies, ordering and review of radiographic studies, pulse oximetry and re-evaluation of patient's condition.    MDM   Final diagnoses:  CVA (cerebral infarction)    Patient presented as a code stroke. Was taken immediately to CT after medical screening [place]. Evaluated by me and the stroke team. Patient symptoms seem to be consistent with a stroke but had been present since 6:30 in the morning so not a candidate for TPA. Symptoms seem to be improving. MRI/MRA was negative for any acute findings this may represent more TIA component. Neurology will  continue to follow patient will be admitted by the family practice service. 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 Patient will be admitted by the family medicine service.  NIH stroke scale was 3 suspect symptoms started 6:30 in the morning patient not a candidate for TPA neurology may consider one of the protocol treatments.   Shelda Jakes, MD 03/13/13 [phone]

## 2013-03-13 NOTE — H&P (Signed)
FMTS Attending Admit Note Patient seen and examined by me in ED, discussed with resident team and I agree with Dr Lucienne Minkshekkekandam's assessment and plan.  Patient presenting initially with dysarthria and a fall today at work associated with dizziness.  His dysarthria has resolved completely at the time of my exam, as reported by patient and fiance. He denies any pain or discomfort at time of exam other than a mild right-sided headache.  Initial report of MRI/MRA brain is not suggestive of acute stroke.  Plan for admission for continuation of stroke/TIA workup.  Patient reports other, less-intense episodes of similar dizziness and dysarthria in the past year.  Discussed smoking cessation as part of stroke prevention.  Will need to establish primary care upon discharge as well (he does not have a doctor whom he sees regularly).  Appreciate Neurology consult.  Paula ComptonJames Tris Howell, MD

## 2013-03-13 NOTE — Progress Notes (Signed)
Patient was admitted to the hospital for possible TIA. During evaluation, patient was approached by the research staff about the clinical trial Socrates. The primary objective of the study is to compare the effect of 90-day treatment with ticagrelor (180 mg [two 90 mg tablets] loading dose on Day 1 followed by 90 mg twice daily maintenance dose for the remainder of the study) vs acetylsalicylic acid (ASA)-aspirin (300 mg [three 100 mg tablets] loading dose on Day 1 followed by 100 mg once daily maintenance dose for the remainder of the study) for the prevention of major vascular events (composite of stroke, myocardial infarction [MI], and death) in patients with acute ischaemic stroke or transient ischaemic attack (TIA). Patient was given the informed consent to read. The opportunity to ask questions was given. Patient read the Socrates informed consent . No questions were asked by patient. Signatures were obtain and a copy of the signed informed consent was given to patient. Patient meet the inclusion/exclusion criteria and randomized into the trial. Patient was given study drug at 16:02. [facility] for any trial details.

## 2013-03-13 NOTE — Code Documentation (Signed)
Code stroke called at 1222 [facility].  AS per patient and family, LSN 0630, when wife was driving patient to work, he laid back in the seat and stated he did not feel well and was dizzy.  He then stumbled a bit getting out of the car.  He then called his wife and stated he needed to go to the ER.  He states he feels dizzy, and his right cheek feels funny.   NIHSS 3  Patient arrived in MRI at 1254

## 2013-03-14 DIAGNOSIS — R29810 Facial weakness: Secondary | ICD-10-CM

## 2013-03-14 DIAGNOSIS — M1990 Unspecified osteoarthritis, unspecified site: Secondary | ICD-10-CM

## 2013-03-14 DIAGNOSIS — I059 Rheumatic mitral valve disease, unspecified: Secondary | ICD-10-CM

## 2013-03-14 DIAGNOSIS — G459 Transient cerebral ischemic attack, unspecified: Secondary | ICD-10-CM

## 2013-03-14 DIAGNOSIS — M129 Arthropathy, unspecified: Secondary | ICD-10-CM

## 2013-03-14 LAB — LIPID PANEL
CHOLESTEROL: 118 mg/dL (ref 0–200)
HDL: 65 mg/dL (ref >39)
LDL Cholesterol: 48 mg/dL (ref 0–99)
TRIGLYCERIDES: 23 mg/dL (ref <150)
Total CHOL/HDL Ratio: 1.8 RATIO
VLDL: 5 mg/dL (ref 0–40)

## 2013-03-14 LAB — GLUCOSE, CAPILLARY
Glucose-Capillary: 85 mg/dL (ref 70–99)
Glucose-Capillary: 74 mg/dL (ref 70–99)

## 2013-03-14 LAB — CBC
HEMATOCRIT: 36.7 % — AB (ref 39.0–52.0)
Hemoglobin: 13 g/dL (ref 13.0–17.0)
MCH: 34.3 pg — AB (ref 26.0–34.0)
MCHC: 35.4 g/dL (ref 30.0–36.0)
MCV: 96.8 fL (ref 78.0–100.0)
Platelets: 121 10*3/uL — ABNORMAL LOW (ref 150–400)
RBC: 3.79 MIL/uL — ABNORMAL LOW (ref 4.22–5.81)
RDW: 14.5 % (ref 11.5–15.5)
WBC: 5.3 10*3/uL (ref 4.0–10.5)

## 2013-03-14 LAB — HEMOGLOBIN A1C
HEMOGLOBIN A1C: 4.7 % (ref <5.7)
MEAN PLASMA GLUCOSE: 88 mg/dL (ref <117)

## 2013-03-14 MED ORDER — ATORVASTATIN CALCIUM 20 MG PO TABS
20.0000 mg | ORAL_TABLET | Freq: Every day | ORAL | Status: DC
  Filled 2013-03-14: qty 1

## 2013-03-14 MED ORDER — TRAMADOL HCL 50 MG PO TABS: 50.0000 mg | ORAL_TABLET | Freq: Four times a day (QID) | ORAL | Status: DC | PRN

## 2013-03-14 MED ORDER — TRAMADOL HCL 50 MG PO TABS: 50.0000 mg | ORAL_TABLET | Freq: Two times a day (BID) | ORAL | Status: AC | PRN

## 2013-03-14 MED ORDER — ATORVASTATIN CALCIUM 20 MG PO TABS: 20.0000 mg | ORAL_TABLET | Freq: Every day | ORAL | Status: AC

## 2013-03-14 NOTE — Discharge Summary (Signed)
[facility]  Patient name: Steve Cook Medical record number: [id number] Date of birth: Aug 03, 1965 Age: 48 y.o. Gender: male Date of Admission: 03/13/2013  Date of Discharge: 03/14/2013 Admitting Physician: Barbaraann BarthelJames O Breen, MD  Primary Care Provider: No PCP Per Patient Consultants: Neurology  Indication for Hospitalization: Dizzy, fall  Discharge Diagnoses/Problem List:  Transient ischemic attack Osteoarthritis of knees (bilateral) Tobacco abuse History of nephrolithiasis  Disposition: home  Discharge Condition: stable, improved  Brief Hospital Course:  Steve Cook is a 48 y.o. male that was admitted for dizziness and fall concerning for TIA. Neurology was consulted in the ED and deemed not a candidate for TPA. Exam concerning for decreased sensation on the right face, arm, leg and unsteady gait with heel-to-toe. Initial workup included CT, MRI, and MRA of the brain that were all normal; Cmet significant for low protein (5.8) and albumin (2.9), CBC with low platelets (126), negative ethanol, UDS and UA. TIA workup done and included A1c 4.7, lipid panel (HDL 65, LDL 48). By the next morning his symptoms were completely resolved with a normal exam and gait. Carotid duplex and echocardiogram were normal (EF 50-55%). While in the hospital he was enrolled in the Socrates trial and received either aspirin or ticagrelor for antiplatelet therapy to be continued on discharge.   Issues for Follow Up:  1. Neck/facial swelling: not seen by medical team and previously evaluated in ED, concern for possible angioedema. 2. Establish PCP: [facility] and establish care. 3. Mildly malnourished: low protein and albumin, per fiance was skipping meals to lose weight. Discussed with patient that skipping meals is an unhealthy way to lose weight, can temporarily use Ensure for several weeks to help in addition to not skipping  meals.  Significant Procedures: none  Significant Labs and Imaging:   Recent Labs Lab 03/13/13 1232 03/13/13 1242 03/14/13 0446  WBC 5.5  --  5.3  HGB 13.4 13.3 13.0  HCT 38.3* 39.0 36.7*  PLT 126*  --  121*    Recent Labs Lab 03/13/13 1232 03/13/13 1242  NA 138 141  K 3.7 3.6*  CL 105 105  CO2 24  --   GLUCOSE 137* 136*  BUN 12 11  CREATININE 0.94 1.00  CALCIUM 8.5  --   ALKPHOS 78  --   AST 25  --   ALT 18  --   ALBUMIN 2.9*  --    Lipid Panel     Component Value Date/Time   CHOL 118 03/14/2013 0446   TRIG 23 03/14/2013 0446   HDL 65 03/14/2013 0446   CHOLHDL 1.8 03/14/2013 0446   VLDL 5 03/14/2013 0446   LDLCALC 48 03/14/2013 0446   UDS negative UA trace hemoglobin, trace leukocytes Hemoglobin A1c  Results/Tests Pending at Time of Discharge: none  Discharge Medications:    Medication List    STOP taking these medications       meloxicam 7.5 MG tablet  Commonly known as:  MOBIC      TAKE these medications       atorvastatin 20 MG tablet  Commonly known as:  LIPITOR  Take 1 tablet (20 mg total) by mouth daily at 6 PM.     traMADol 50 MG tablet  Commonly known as:  ULTRAM  Take 1 tablet (50 mg total) by mouth every 12 (twelve) hours as needed for moderate pain.        Discharge Instructions: Please refer to  Patient Instructions section of EMR for full details.  Patient was counseled important signs and symptoms that should prompt return to medical care, changes in medications, dietary instructions, activity restrictions, and follow up appointments.   Follow-Up Appointments:     Follow-up Information   Follow up with Myra Rude, MD On 03/20/2013. (at 4pm for hospital follow up)    Specialty:  Family Medicine   Contact information:   [address] [place] [address] [phone]       Tawni Carnes, MD 03/14/2013, 3:35 PM PGY-1, [facility]

## 2013-03-14 NOTE — Progress Notes (Signed)
PT Cancellation Note  Patient Details Name: Steve Cook MRN: [id number] DOB: 1965/03/02   Cancelled Treatment:    Reason Eval/Treat Not Completed: PT screened, no needs identified, will sign off   [name], [name] 03/14/2013, 1:05 PM [name], PT DPT  [phone]

## 2013-03-14 NOTE — Progress Notes (Signed)
Patient seen and examined by me, discussed with resident team and I agree with Dr Laban EmperorWight's assessment and plan.  Patient reports complete resolution of the dysarthria. His fiance, also in the room, is in agreement with this.  Plan for completion of TIA workup and institution of primary (?secondary) stroke prevention plan. Discussed smoking cessation, control of blood sugar and blood pressure, and antiplatelet therapy.  Patient's antiplatelet therapy is directed by Neurology, as patient is being enrolled in stroke study.  Paula ComptonJames Drena Ham, MD

## 2013-03-14 NOTE — Progress Notes (Signed)
Family Medicine Teaching Service Daily Progress Note Intern Pager: [phone]  Patient name: Steve Cook Medical record number: [id number] Date of birth: 11/14/1965 Age: 48 y.o. Gender: male  Primary Care Provider: No PCP Per Patient Consultants: Neurology Code Status: Full  Pt Overview and Major Events to Date:  3/3 - complete TIA workup  Assessment and Plan: Steve Cook is a 48 y.o. male presenting with dizziness and fall. PMH is significant for kidney stones, knee arthritis, history of dizzy spells and facial swelling.   # TIA / Dizziness: code stroke called in ED, seen by neuro and deemed not a candidate for TPA. CT head and MRI/MRA brain negative for acute stroke. EKG with occasional narrow PVCs. Lab workup: Cmet with low protein 5.8, albumin 2.9; CBC wnl, INR 1.20, normal ethanol, normal i-stat troponin, UA trace hemoglobin and leukocytes, negative UDS. Patient currently with normal speech (though fiancee does most of history), exam significant for decreased sensation of right face/arm/leg and stumbled to right while doing heel-to-toe.  - complete TIA workup including echo, carotid doppler, lipid panel (Tchol 118, TG 23, HDL 65, LDL 48), A1c (4.7) - start atorvastatin 20mg  daily - enrolled in stroke clinical trial in ED: patient to continue with aspirin vs ticagrelor for antiplatelet (Neuro was recommending 81mg  ASA)  - neuro checks q4hrs  - PT/OT evals   # Bilateral knee arthritis: takes meloxicam twice a day. While enrolled in clinical trial will need to discontinue this.  - stop meloxicam  - tramadol bid PRN, if not tolerating then will restart meloxicam and trial will likely change / discontinue medications.   # Mild thrombocytopenia - PLT count 126; was 143 in January. No signs of bruising or bleeding.  - will continue to monitor with CBC  FEN/GI: NPO pending bedside swallow, saline lock IV  Prophylaxis: SCDs  Disposition: pending TIA workup, clinical  improvement  Subjective:  Patient states he feels better this morning, sensation on right side better. Fiance and daughter complain of noticing facial and neck swelling that has resolved. Denies any pain currently.  Objective: Temp:  [97.8 F (36.6 C)-99.1 F (37.3 C)] 98.3 F (36.8 C) (03/03 0540) Pulse Rate:  [65-96] 65 (03/03 0540) Resp:  [10-26] 20 (03/03 0540) BP: (108-156)/(59-99) 116/59 mmHg (03/03 0540) SpO2:  [97 %-100 %] 97 % (03/03 0540) Physical Exam: General: NAD HEENT: PERRL, EOMI. No nystagmus noted Neck: no notable swelling, palpable lymph nodes submandibular bilaterally Cardiovascular: RRR, normal heart sounds, no murmurs Respiratory: CTAB, normal effort Abdomen: soft, ntnd, normal bowel sounds Extremities: 2+ pitting edema b/l Neuro: A+Ox4, CN2-12 normal (sensation returned to normal on right face). Strength 5/5 bilaterally in upper and lower extremities. No pronator drift. Still has some persistent unsteady gait, primarily on heel to toe.  Laboratory:  Recent Labs Lab 03/13/13 1232 03/13/13 1242 03/14/13 0446  WBC 5.5  --  5.3  HGB 13.4 13.3 13.0  HCT 38.3* 39.0 36.7*  PLT 126*  --  121*    Recent Labs Lab 03/13/13 1232 03/13/13 1242  NA 138 141  K 3.7 3.6*  CL 105 105  CO2 24  --   BUN 12 11  CREATININE 0.94 1.00  CALCIUM 8.5  --   PROT 5.8*  --   BILITOT 1.1  --   ALKPHOS 78  --   ALT 18  --   AST 25  --   GLUCOSE 137* 136*   Lipid Panel     Component Value Date/Time   CHOL 118 03/14/2013  0446   TRIG 23 03/14/2013 0446   HDL 65 03/14/2013 0446   CHOLHDL 1.8 03/14/2013 0446   VLDL 5 03/14/2013 0446   LDLCALC 48 03/14/2013 0446   A1c 4.7 UDS negative  Imaging/Diagnostic Tests: Ct Head Wo Contrast  03/13/2013   CLINICAL DATA:  Code stroke, right-sided weakness starting 7 a.m. today  EXAM: CT HEAD WITHOUT CONTRAST  TECHNIQUE: Contiguous axial images were obtained from the base of the skull through the vertex without intravenous contrast.   COMPARISON:  None.  FINDINGS: No mass lesion. No midline shift. No acute hemorrhage or hematoma. No extra-axial fluid collections. No evidence of acute infarction.  IMPRESSION: Negative head CT  Critical Value/emergent results were called by telephone at the time of interpretation on 03/13/2013 at 12:47 PM to Dr. Cyril Mourning , who verbally acknowledged these results.   Electronically Signed   By: Esperanza Heir M.D.   On: 03/13/2013 12:47   Mr Maxine Glenn Head Wo Contrast  03/13/2013   CLINICAL DATA:  Dysarthria and right-sided weakness. Evaluate for stroke.  EXAM: MRI HEAD WITHOUT CONTRAST  MRA HEAD WITHOUT CONTRAST  TECHNIQUE: Multiplanar, multiecho pulse sequences of the brain and surrounding structures were obtained without intravenous contrast. Angiographic images of the head were obtained using MRA technique without contrast.  COMPARISON:  Head CT 03/13/2013  FINDINGS: MRI HEAD FINDINGS  There is no evidence of acute infarct or intracranial hemorrhage. Ventricles and sulci are within normal limits for age. There is no evidence of mass, midline shift, or extra-axial fluid collection. Major intracranial vascular flow voids are preserved. Orbits are unremarkable. Paranasal sinuses and mastoid air cells are clear.  MRA HEAD FINDINGS  The visualized distal vertebral arteries are patent with the left being minimally larger than the right. PICA origins are patent. AICAs are patent. SCA origins are patent. PCAs are unremarkable. There is a patent left posterior communicating artery. Internal carotid arteries are patent from skullbase to carotid termini. Carotid siphons are mildly ectatic, right more so than left. ACAs and MCAs are unremarkable. Anterior communicating artery is patent. No intracranial aneurysm.  IMPRESSION: 1. Unremarkable appearance of the brain.  No evidence of stroke. 2. Mildly ectatic carotid siphons, otherwise unremarkable head MRA.   Electronically Signed   By: Sebastian Ache   On: 03/13/2013 13:58   Mr  Brain Wo Contrast  03/13/2013   CLINICAL DATA:  Dysarthria and right-sided weakness. Evaluate for stroke.  EXAM: MRI HEAD WITHOUT CONTRAST  MRA HEAD WITHOUT CONTRAST  TECHNIQUE: Multiplanar, multiecho pulse sequences of the brain and surrounding structures were obtained without intravenous contrast. Angiographic images of the head were obtained using MRA technique without contrast.  COMPARISON:  Head CT 03/13/2013  FINDINGS: MRI HEAD FINDINGS  There is no evidence of acute infarct or intracranial hemorrhage. Ventricles and sulci are within normal limits for age. There is no evidence of mass, midline shift, or extra-axial fluid collection. Major intracranial vascular flow voids are preserved. Orbits are unremarkable. Paranasal sinuses and mastoid air cells are clear.  MRA HEAD FINDINGS  The visualized distal vertebral arteries are patent with the left being minimally larger than the right. PICA origins are patent. AICAs are patent. SCA origins are patent. PCAs are unremarkable. There is a patent left posterior communicating artery. Internal carotid arteries are patent from skullbase to carotid termini. Carotid siphons are mildly ectatic, right more so than left. ACAs and MCAs are unremarkable. Anterior communicating artery is patent. No intracranial aneurysm.  IMPRESSION: 1. Unremarkable appearance of the brain.  No evidence of stroke. 2. Mildly ectatic carotid siphons, otherwise unremarkable head MRA.   Electronically Signed   By: Sebastian AcheAllen  Grady   On: 03/13/2013 13:58    Tawni CarnesAndrew Zeena Starkel, MD 03/14/2013, 8:19 AM PGY-1, [facility] FPTS Intern pager: [phone], text pages welcome

## 2013-03-14 NOTE — Evaluation (Signed)
Occupational Therapy Evaluation Patient Details Name: Steve Cook MRN: [id number] DOB: [birth year] Today's Date: 03/14/2013 Time: [id number] OT Time Calculation (min): 11 min  OT Assessment / Plan / Recommendation History of present illness   48 y.o. male admitted with dizziness and fall.    Clinical Impression   Pt moving well during evaluation. Education provided to pt and his spouse. Pt has no further OT needs.     OT Assessment  Patient does not need any further OT services    Follow Up Recommendations  No OT follow up    Barriers to Discharge      Equipment Recommendations  None recommended by OT    Recommendations for Other Services    Frequency       Precautions / Restrictions Restrictions Weight Bearing Restrictions: No   Pertinent Vitals/Pain Pain in back. Increased activity during session.     ADL  Lower Body Dressing: Modified independent Where Assessed - Lower Body Dressing: Unsupported sit to stand Toilet Transfer: Supervision/safety Toilet Transfer Method: Sit to Barist[place] Toilet Transfer Equipment: Regular height toilet Tub/Shower Transfer: Modified independent Tub/Shower Transfer Method: Science writerAmbulating Tub/Shower Transfer Equipment: Other (comment) (practiced stepping over) Equipment Used: Gait belt Transfers/Ambulation Related to ADLs: Supervision/Mod I for ambulation-cues to slow down.  ADL Comments: Educated on signs/symptoms of stroke and importance of getting help right away. Educated and recommended to stop smoking and also recommended avoiding canned foods.     OT Diagnosis:    OT Problem List:   OT Treatment Interventions:     OT Goals(Current goals can be found in the care plan section)    Visit Information  Last OT Received On: 03/14/13 Assistance Needed: +1       Prior Functioning     Home Living Family/patient expects to be discharged to:: Private residence Living Arrangements: Spouse/significant other Prior Function Level of  Independence: Independent Communication Communication: No difficulties         Vision/Perception Vision - History Baseline Vision: Wears glasses only for reading Patient Visual Report: No change from baseline Vision - Assessment Vision Assessment: Vision tested Tracking/Visual Pursuits: Able to track stimulus in all quads without difficulty Visual Fields: No apparent deficits   Cognition  Cognition Arousal/Alertness: Awake/alert Behavior During Therapy: WFL for tasks assessed/performed Overall Cognitive Status: Within Functional Limits for tasks assessed    Extremity/Trunk Assessment Upper Extremity Assessment Upper Extremity Assessment: Overall WFL for tasks assessed Lower Extremity Assessment Lower Extremity Assessment: Overall WFL for tasks assessed     Mobility Bed Mobility Overal bed mobility: Modified Independent Transfers Overall transfer level: Needs assistance Equipment used: None Transfers: Sit to/from Stand Sit to Stand: Modified independent (Device/Increase time);Supervision General transfer comment: Supervision for toilet transfer.     Exercise     Balance     End of Session OT - End of Session Equipment Utilized During Treatment: Gait belt Activity Tolerance: Patient tolerated treatment well Patient left: in bed;with call bell/phone within reach;with family/visitor present Nurse Communication: Mobility status  GO     Steve RavelingStraub, Steve Cook OTR/Cook [phone] 03/14/2013, 1:13 PM

## 2013-03-14 NOTE — Progress Notes (Signed)
*  PRELIMINARY RESULTS* Vascular Ultrasound Carotid Duplex (Doppler) has been completed.  Preliminary findings: Bilateral:  1-39% ICA stenosis.  Vertebral artery flow is antegrade.      Farrel DemarkJill Eunice, RDMS, RVT  03/14/2013, 10:57 AM

## 2013-03-14 NOTE — Discharge Instructions (Signed)
You were admitted for Transient Ischemic Attacks (TIA), which are sometimes known as "mini strokes". While in the hospital the Neurology team evaluated you. We did imaging of your head including CT and MRI which did not show any evidence of stroke or any problems with the blood vessels in your brain. We also did lab work to help modify your risk factors for future events. In addition to the Socrates trial you are doing, we also recommend starting a cholesterol lowering medication called Lipitor (atorvastatin). The single best thing you can do for your health right now is to quit all smoking!  The lab tests also showed that you are malnourished. It is very important to make sure you do not skip meals. For each meal we suggest eating plenty of vegetables (should be 25-50% of your plate). You can use supplemental shakes like Ensure as well, though they don't need to be taken long term if you increase your diet to at least 3 regular meals a day.  You are scheduled for follow up with Dr. Jordan LikesSchmitz on Monday 3/9 at 4pm. If this does not work, please call the clinic and ask to be seen by one of the following doctors: Dr. Waynetta SandyWight, Dr. Lorenda Peckhekkekandum, Dr. Ermalinda MemosBradshaw, Dr. Jordan LikesSchmitz, Dr. Richarda BladeAdamo, Dr. Mauricio PoBreen

## 2013-03-14 NOTE — Progress Notes (Signed)
Pt. DC'd home via car with family.  DC instructions and prescriptions given to patient.  Vital signs and assessments were stable.  

## 2013-03-14 NOTE — Progress Notes (Signed)
   CARE MANAGEMENT NOTE 03/14/2013  Patient:  Steve Cook,Steve Cook   Account Number:  [id number]  Date Initiated:  03/14/2013  Documentation initiated by:  Jiles CrockerHANDLER,Marvis Bakken  Subjective/Objective Assessment:   ADMITTED FOR TIA     Action/Plan:   CM FOLLOWING FOR DCP   Anticipated DC Date:  03/16/2013   Anticipated DC Plan:  HOME/SELF CARE     DC Planning Services  CM consult        Status of service:  In process, will continue to follow Medicare Important Message given?  NA - LOS <3 / Initial given by admissions (If response is "NO", the following Medicare IM given date fields will be blank)  Per UR Regulation:  Reviewed for med. necessity/level of care/duration of stay  Comments:  3/3/2015Abelino Derrick- B Meili Kleckley RN,BSN,MHA [phone]

## 2013-03-14 NOTE — Progress Notes (Signed)
  Echocardiogram 2D Echocardiogram has been performed.  Steve Cook, Steve Cook 03/14/2013, 10:53 AM

## 2013-03-14 NOTE — Progress Notes (Signed)
Stroke Team Progress Note  HISTORY Steve Cook is an 48 y.o. male who awoke at 4 am feeling fine. His wife noted he mentioned he felt unstable on his feet around 6:30. As the morning continued she noted his speech was slightly slurred. At 7 AM he went home to change and felt very off balance to the point he fell. He feels he fell to the right. Later he called his wife stating he had a slight HA and did not feel well. Patient was brought to ED as a code stroke due to his dysarthria. Currently he continues to have some dysarthria and states his left forehead feels more sensitive than his right but his right lower face also feels more sensitive than his left.  Of note: He was here in the hospital on the 29th of January. At that time he was complaining of left eye pain and left eye swelling. Neurology was spoken to at that time and it was recommended a SED rate be obtained. This was and returned WNL.   Date last known well: Date: 03/13/2013  Time last known well: Time: 06:30  tPA Given: No: out of IV tPA window  NIHSS: 3  SUBJECTIVE His wife, daughter and grandson are at the bedside.  Overall he feels his condition is stable. Problems with balance off and on since Dec. patient states his balance and walking has improved but he still has some right lower facial weakness which as per his wife is intermittent but improving . OBJECTIVE Most recent Vital Signs: Filed Vitals:   03/14/13 0330 03/14/13 0540 03/14/13 0959 03/14/13 1117  BP: 121/61 116/59 129/76 121/67  Pulse: 68 65 74   Temp: 98.7 F (37.1 C) 98.3 F (36.8 C) 98 F (36.7 C)   TempSrc:  Oral Oral   Resp:  20 18   SpO2:  97% 100%    CBG (last 3)   Recent Labs  03/13/13 2148 03/14/13 0643 03/14/13 1148  GLUCAP 112* 85 74    IV Fluid Intake:     MEDICATIONS  . Placebo / aspirin maintenance dose  100 mg Oral Daily  . Placebo / ticagrelor maintenance dose  90 mg Oral BID  . sodium chloride  3 mL Intravenous Q12H   PRN:   sodium chloride, sodium chloride, traMADol   CLINICALLY SIGNIFICANT STUDIES Basic Metabolic Panel:  Recent Labs Lab 03/13/13 1232 03/13/13 1242  NA 138 141  K 3.7 3.6*  CL 105 105  CO2 24  --   GLUCOSE 137* 136*  BUN 12 11  CREATININE 0.94 1.00  CALCIUM 8.5  --    Liver Function Tests:  Recent Labs Lab 03/13/13 1232  AST 25  ALT 18  ALKPHOS 78  BILITOT 1.1  PROT 5.8*  ALBUMIN 2.9*   CBC:  Recent Labs Lab 03/13/13 1232 03/13/13 1242 03/14/13 0446  WBC 5.5  --  5.3  NEUTROABS 3.3  --   --   HGB 13.4 13.3 13.0  HCT 38.3* 39.0 36.7*  MCV 96.5  --  96.8  PLT 126*  --  121*   Coagulation:  Recent Labs Lab 03/13/13 1232  LABPROT 14.9  INR 1.20   Cardiac Enzymes: No results found for this basename: CKTOTAL, CKMB, CKMBINDEX, TROPONINI,  in the last 168 hours Urinalysis:  Recent Labs Lab 03/13/13 1310  COLORURINE YELLOW  LABSPEC 1.007  PHURINE 7.0  GLUCOSEU NEGATIVE  HGBUR TRACE*  BILIRUBINUR NEGATIVE  KETONESUR NEGATIVE  PROTEINUR NEGATIVE  UROBILINOGEN 0.2  NITRITE  NEGATIVE  LEUKOCYTESUR TRACE*   Lipid Panel    Component Value Date/Time   CHOL 118 03/14/2013 0446   TRIG 23 03/14/2013 0446   HDL 65 03/14/2013 0446   CHOLHDL 1.8 03/14/2013 0446   VLDL 5 03/14/2013 0446   LDLCALC 48 03/14/2013 0446   HgbA1C  Lab Results  Component Value Date   HGBA1C 4.7 03/13/2013    Urine Drug Screen:     Component Value Date/Time   LABOPIA NONE DETECTED 03/13/2013 1310   COCAINSCRNUR NONE DETECTED 03/13/2013 1310   LABBENZ NONE DETECTED 03/13/2013 1310   AMPHETMU NONE DETECTED 03/13/2013 1310   THCU NONE DETECTED 03/13/2013 1310   LABBARB NONE DETECTED 03/13/2013 1310    Alcohol Level:  Recent Labs Lab 03/13/13 1232  ETH <11    Ct Head Wo Contrast  03/13/2013   CLINICAL DATA:  Code stroke, right-sided weakness starting 7 a.m. today  EXAM: CT HEAD WITHOUT CONTRAST  TECHNIQUE: Contiguous axial images were obtained from the base of the skull through the vertex without  intravenous contrast.  COMPARISON:  None.  FINDINGS: No mass lesion. No midline shift. No acute hemorrhage or hematoma. No extra-axial fluid collections. No evidence of acute infarction.  IMPRESSION: Negative head CT  Critical Value/emergent results were called by telephone at the time of interpretation on 03/13/2013 at 12:47 PM to Dr. Cyril Mourningamillo , who verbally acknowledged these results.   Electronically Signed   By: Esperanza Heiraymond  Rubner M.D.   On: 03/13/2013 12:47   Mr Maxine GlennMra Head Wo Contrast  03/13/2013   CLINICAL DATA:  Dysarthria and right-sided weakness. Evaluate for stroke.  EXAM: MRI HEAD WITHOUT CONTRAST  MRA HEAD WITHOUT CONTRAST  TECHNIQUE: Multiplanar, multiecho pulse sequences of the brain and surrounding structures were obtained without intravenous contrast. Angiographic images of the head were obtained using MRA technique without contrast.  COMPARISON:  Head CT 03/13/2013  FINDINGS: MRI HEAD FINDINGS  There is no evidence of acute infarct or intracranial hemorrhage. Ventricles and sulci are within normal limits for age. There is no evidence of mass, midline shift, or extra-axial fluid collection. Major intracranial vascular flow voids are preserved. Orbits are unremarkable. Paranasal sinuses and mastoid air cells are clear.  MRA HEAD FINDINGS  The visualized distal vertebral arteries are patent with the left being minimally larger than the right. PICA origins are patent. AICAs are patent. SCA origins are patent. PCAs are unremarkable. There is a patent left posterior communicating artery. Internal carotid arteries are patent from skullbase to carotid termini. Carotid siphons are mildly ectatic, right more so than left. ACAs and MCAs are unremarkable. Anterior communicating artery is patent. No intracranial aneurysm.  IMPRESSION: 1. Unremarkable appearance of the brain.  No evidence of stroke. 2. Mildly ectatic carotid siphons, otherwise unremarkable head MRA.   Electronically Signed   By: Sebastian AcheAllen  Grady   On:  03/13/2013 13:58   Mr Brain Wo Contrast  03/13/2013   CLINICAL DATA:  Dysarthria and right-sided weakness. Evaluate for stroke.  EXAM: MRI HEAD WITHOUT CONTRAST  MRA HEAD WITHOUT CONTRAST  TECHNIQUE: Multiplanar, multiecho pulse sequences of the brain and surrounding structures were obtained without intravenous contrast. Angiographic images of the head were obtained using MRA technique without contrast.  COMPARISON:  Head CT 03/13/2013  FINDINGS: MRI HEAD FINDINGS  There is no evidence of acute infarct or intracranial hemorrhage. Ventricles and sulci are within normal limits for age. There is no evidence of mass, midline shift, or extra-axial fluid collection. Major intracranial vascular flow voids  are preserved. Orbits are unremarkable. Paranasal sinuses and mastoid air cells are clear.  MRA HEAD FINDINGS  The visualized distal vertebral arteries are patent with the left being minimally larger than the right. PICA origins are patent. AICAs are patent. SCA origins are patent. PCAs are unremarkable. There is a patent left posterior communicating artery. Internal carotid arteries are patent from skullbase to carotid termini. Carotid siphons are mildly ectatic, right more so than left. ACAs and MCAs are unremarkable. Anterior communicating artery is patent. No intracranial aneurysm.  IMPRESSION: 1. Unremarkable appearance of the brain.  No evidence of stroke. 2. Mildly ectatic carotid siphons, otherwise unremarkable head MRA.   Electronically Signed   By: Sebastian Ache   On: 03/13/2013 13:58    2D Echocardiogram  EF 50-55% with no source of embolus.   Carotid Doppler  No evidence of hemodynamically significant internal carotid artery stenosis. Vertebral artery flow is antegrade.   EKG  normal sinus rhythm. For complete results please see formal report.   Therapy Recommendations no needs  Physical Exam   Awake alert. Afebrile. Head is nontraumatic. Neck is supple without bruit. Hearing is normal. Cardiac  exam no murmur or gallop. Lungs are clear to auscultation. Distal pulses are well felt. Neurological Exam ;  Awake  Alert oriented x 3. Normal speech and language.eye movements full without nystagmus.fundi were not visualized. Vision acuity and fields appear normal. Hearing is normal. Palatal movements are normal. Face asymmetric with mild right lower face weakness. Tongue midline. Normal strength, tone, reflexes and coordination. Normal sensation. Gait deferred.  NIHSS 1 ASSESSMENT Mr. Dequann Vandervelden is a 48 y.o. male presenting with dysarthria.  Imaging confirms no acute stroke. Dx:  TIA. .  On no antithrombotics prior to admission. Now on Socrates study medication for secondary stroke prevention. Patient with no resultant neuro deficits. Work up completed.   LDL and HgbA1c ok  Possible obstructive sleep apnea, wife reports decreased and restless sleep, snoring  Hospital day # 1  TREATMENT/PLAN  Continue Socrates drug for secondary stroke prevention.  SOCRATES drug to be dispensed home with patient. Order written for RN to obtain from the pharmacy.   The primary objective of the study is to compare the effect of 90-day treatment with ticagrelor (180 mg \ two 90 mg tablets] loading dose on Day 1 followed by 90 mg twice daily maintenance dose for the remainder of the study) vs acetylsalicylic acid (ASA)-aspirin (300 mg [three 100 mg tablets] loading dose on Day 1 followed by 100 mg once daily maintenance dose for the remainder of the study) for the prevention of major vascular events (composite of stroke, myocardial infarction [MI], and death) in patients with acute ischaemic stroke or TIA.  Return to work in 1 week  Follow up Dr. Pearlean Brownie, [facility]roke [facility] obstructive sleep apnea OP Workup  Annie Main, MSN, RN, ANVP-BC, ANP-BC, GNP-BC [facility] Pager: [phone] 03/14/2013 12:08 PM  I have personally obtained a history, examined the patient, evaluated imaging results, and  formulated the assessment and plan of care. I agree with the above. Delia Heady, MD

## 2013-03-15 ENCOUNTER — Emergency Department (HOSPITAL_COMMUNITY): Payer: BC Managed Care – PPO

## 2013-03-15 ENCOUNTER — Emergency Department (HOSPITAL_COMMUNITY)
Admission: EM | Admit: 2013-03-15 | Discharge: 2013-03-15 | Disposition: A | Discharge status: Home / Self Care | Payer: BC Managed Care – PPO | Attending: Emergency Medicine | Admitting: Emergency Medicine

## 2013-03-15 ENCOUNTER — Encounter (HOSPITAL_COMMUNITY): Payer: Self-pay | Admitting: Emergency Medicine

## 2013-03-15 DIAGNOSIS — F17200 Nicotine dependence, unspecified, uncomplicated: Secondary | ICD-10-CM | POA: Insufficient documentation

## 2013-03-15 DIAGNOSIS — G459 Transient cerebral ischemic attack, unspecified: Secondary | ICD-10-CM

## 2013-03-15 DIAGNOSIS — Z79899 Other long term (current) drug therapy: Secondary | ICD-10-CM | POA: Insufficient documentation

## 2013-03-15 DIAGNOSIS — Z87442 Personal history of urinary calculi: Secondary | ICD-10-CM | POA: Insufficient documentation

## 2013-03-15 DIAGNOSIS — M1710 Unilateral primary osteoarthritis, unspecified knee: Secondary | ICD-10-CM | POA: Insufficient documentation

## 2013-03-15 DIAGNOSIS — IMO0002 Reserved for concepts with insufficient information to code with codable children: Secondary | ICD-10-CM

## 2013-03-15 DIAGNOSIS — Z791 Long term (current) use of non-steroidal anti-inflammatories (NSAID): Secondary | ICD-10-CM | POA: Insufficient documentation

## 2013-03-15 HISTORY — DX: Transient cerebral ischemic attack, unspecified: G45.9

## 2013-03-15 LAB — I-STAT CHEM 8, ED
BUN: 11 mg/dL (ref 6–23)
Calcium, Ion: 1.3 mmol/L — ABNORMAL HIGH (ref 1.12–1.23)
Chloride: 106 mEq/L (ref 96–112)
Creatinine, Ser: 1.1 mg/dL (ref 0.50–1.35)
Glucose, Bld: 86 mg/dL (ref 70–99)
HCT: 41 % (ref 39.0–52.0)
Hemoglobin: 13.9 g/dL (ref 13.0–17.0)
Potassium: 3.9 mEq/L (ref 3.7–5.3)
Sodium: 143 mEq/L (ref 137–147)
TCO2: 26 mmol/L (ref 0–100)

## 2013-03-15 LAB — CBG MONITORING, ED: Glucose-Capillary: 86 mg/dL (ref 70–99)

## 2013-03-15 MED ORDER — TICAGRELOR 90 MG PO TABS: 90.0000 mg | ORAL_TABLET | Freq: Two times a day (BID) | ORAL | Status: AC

## 2013-03-15 MED ORDER — TICAGRELOR 90 MG PO TABS
90.0000 mg | ORAL_TABLET | Freq: Once | ORAL | Status: AC
  Administered 2013-03-15: 90 mg via ORAL
  Filled 2013-03-15: qty 1
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
## 2013-03-15 NOTE — ED Notes (Signed)
Pt d/c'd yesterday with c/o TIA.  Pt's family states they noticed R facial droop (initially they said 1100 and then they said 1000).  They called Dr's office and was advised to bring pt to ED.  Pt's family states it appears symptoms have resolved.  Pt is able to move all extremities.

## 2013-03-15 NOTE — Discharge Instructions (Signed)
Take brillinta as directed. Follow up with Dr. Pearlean BrownieSethi as scheduled. Follow up with Dr. Mauricio PoBreen as soon as possible. Return to the ED with worsening or concerning symptoms.

## 2013-03-16 NOTE — Discharge Summary (Signed)
Patient seen and examined on day of discharge, I agree with Dr Laban EmperorWight's assessment and plan for discharge.  Steve ComptonJames Kjerstin Abrigo, MD

## 2013-03-20 ENCOUNTER — Inpatient Hospital Stay: Payer: BC Managed Care – PPO | Admitting: Family Medicine

## 2013-03-21 NOTE — ED Provider Notes (Signed)
Medical screening examination/treatment/procedure(s) were performed by non-physician practitioner and as supervising physician I was immediately available for consultation/collaboration.   EKG Interpretation   Date/Time:  Wednesday March 15 2013 13:41:45 EST Ventricular Rate:  66 PR Interval:  122 QRS Duration: 90 QT Interval:  412 QTC Calculation: 431 R Axis:   59 Text Interpretation:  Normal sinus rhythm ST abnormality, possible  digitalis effect Abnormal ECG ED PHYSICIAN INTERPRETATION AVAILABLE IN  [facility] Confirmed by TEST, Record ([id number]) on 03/17/2013 7:21:23 AM        Lyanne CoKevin M Anastyn Ayars, MD 03/21/13 [phone]

## 2013-04-07 ENCOUNTER — Encounter: Payer: Self-pay | Admitting: Neurology

## 2013-04-07 NOTE — Progress Notes (Signed)
Patient was seen today for complaint of recurrent left sided weakness since discharge from the hospital on March 15, 2013. Patient [place] with similar symptoms including left facial weakness since discharge. Patient reports having several episodes of left facial weakness and difficulty walking at times. States that he has difficulty with working due to safety concerns. Dr. Pearlean BrownieSethi evaluated patient and recommended that patient stay out of work for at least 2 months to recovery for the TIA's. Patient was started on Depakote for possible depression per Dr. Pearlean BrownieSethi. Patient to follow up with Dr. Pearlean BrownieSethi in two months in the office.

## 2013-06-08 ENCOUNTER — Encounter (INDEPENDENT_AMBULATORY_CARE_PROVIDER_SITE_OTHER): Payer: Self-pay

## 2013-06-08 DIAGNOSIS — Z0289 Encounter for other administrative examinations: Secondary | ICD-10-CM

## 2013-06-14 ENCOUNTER — Telehealth: Payer: Self-pay | Admitting: *Deleted

## 2013-06-14 NOTE — Telephone Encounter (Signed)
Patient reported having a increase anxiety while going through the process of divorce. Stated that he has difficulty coping with his issues. Dr. Pearlean Brownie was made aware and ordered Xanax 0.25mg  bid X 30 tabs. Patient was scheduled for a follow up appointment with Heide Guile, NP on July 10th.

## 2013-07-21 ENCOUNTER — Ambulatory Visit: Payer: Self-pay | Admitting: Nurse Practitioner

## 2013-07-21 ENCOUNTER — Telehealth: Payer: Self-pay | Admitting: Nurse Practitioner

## 2013-07-21 NOTE — Telephone Encounter (Signed)
Patient was no show for today's office appointment.  

## 2014-08-14 ENCOUNTER — Other Ambulatory Visit: Payer: Self-pay | Admitting: Neurology

## 2015-03-07 IMAGING — CT CT HEAD W/O CM
1 of 2 series · 16 of 30 positions shown, 20 images · non-contrast
Comparison: None

CLINICAL DATA: Left eye droop, visual disturbance, facial weakness

EXAM:
CT HEAD WITHOUT CONTRAST
TECHNIQUE: Contiguous axial images were obtained from the base of the skull
through the vertex without contrast.

[Series 3: head 2.0 h70h · axial · 0.46mm/px · z∈[-133,+15]mm · 16 of 84 slices shown, 20 images]
[im 5/84  brain]
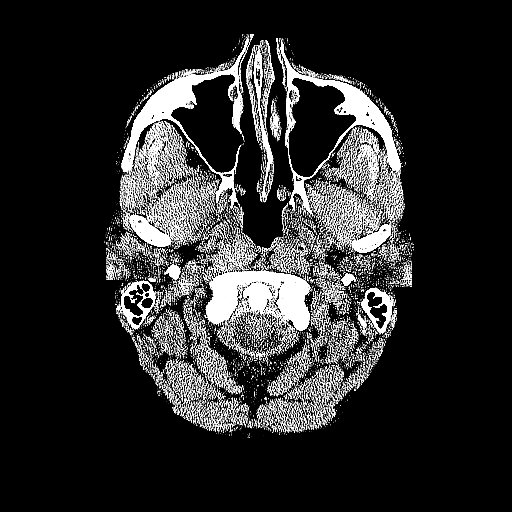
[im 5/84  bone]
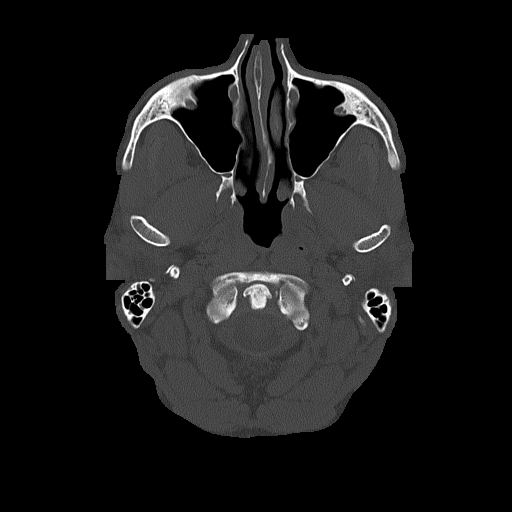
[im 9/84  brain]
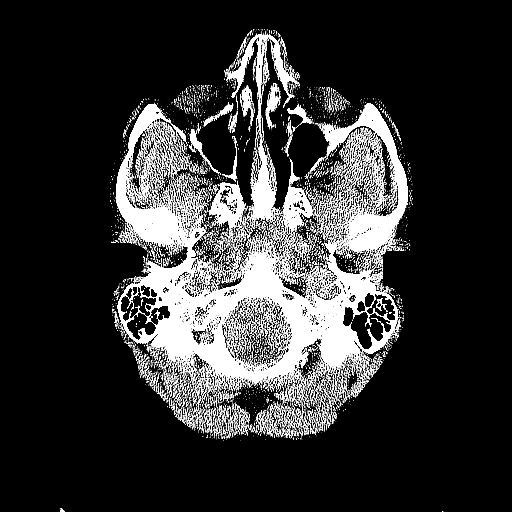
[im 13/84  brain]
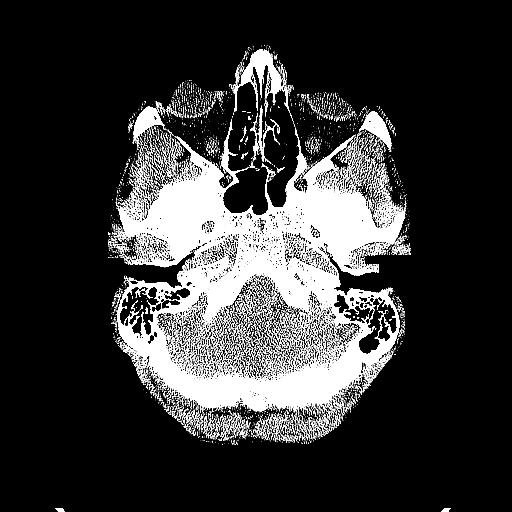
[im 21/84  brain]
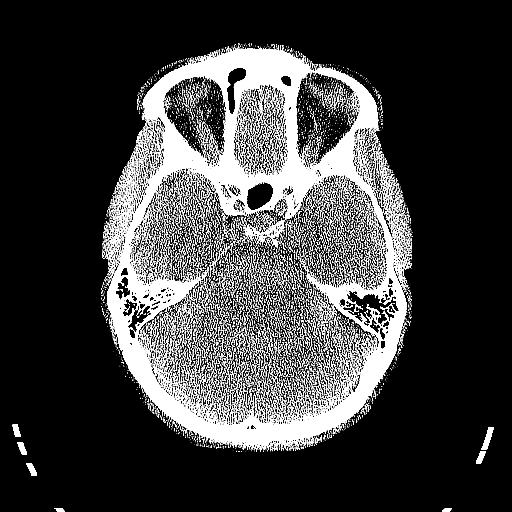
[im 25/84  brain]
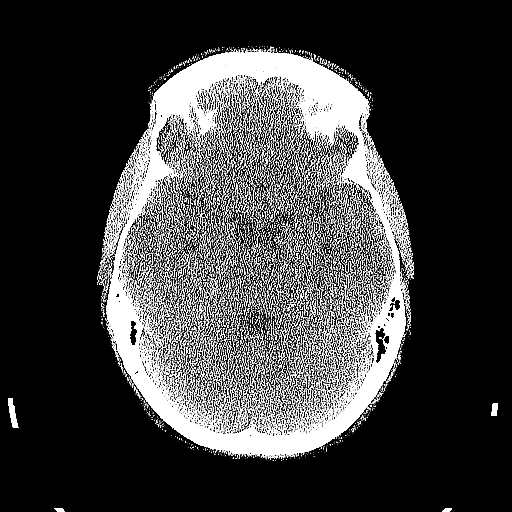
[im 25/84  bone]
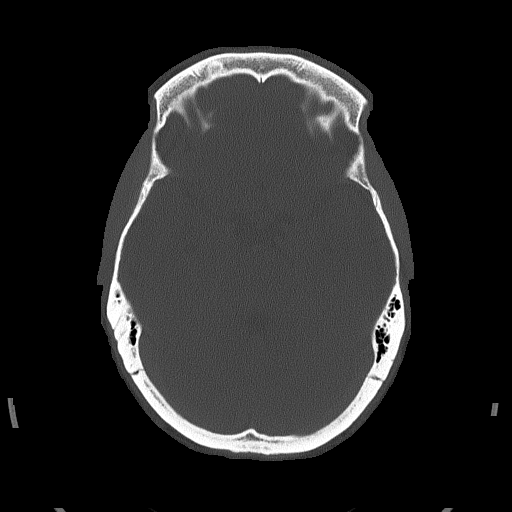
[im 30/84  brain]
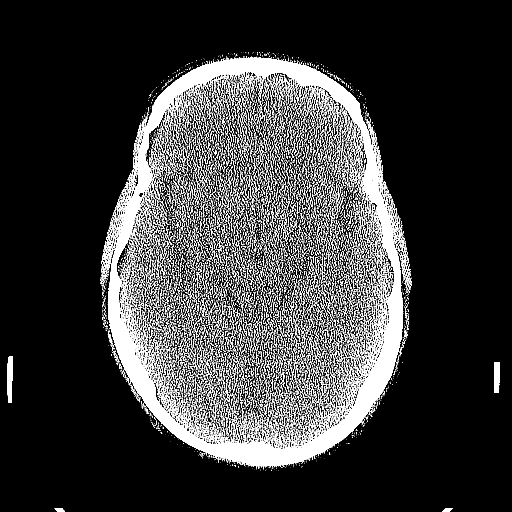
[im 34/84  brain]
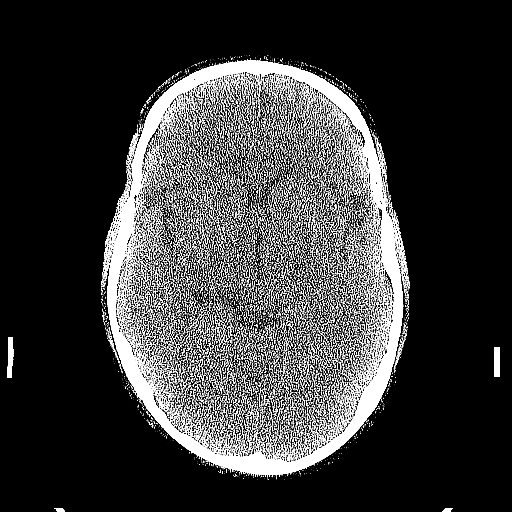
[im 38/84  brain]
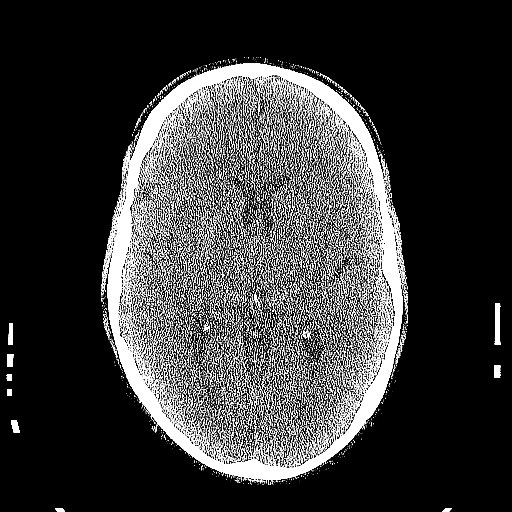
[im 46/84  brain]
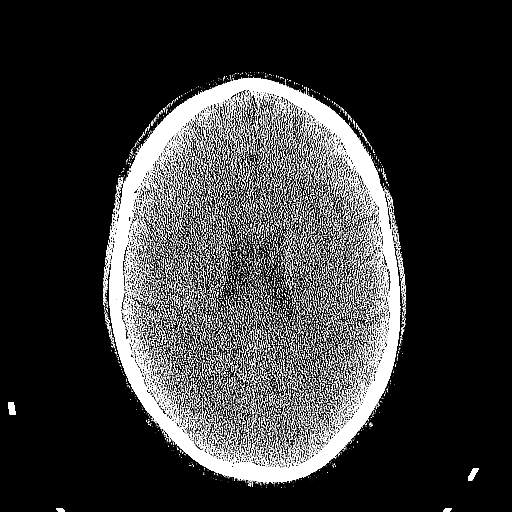
[im 46/84  bone]
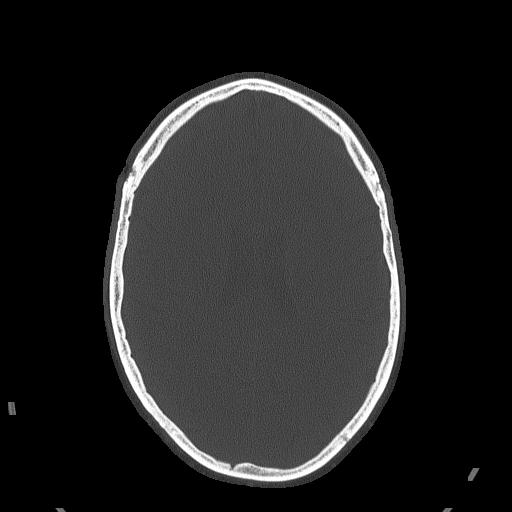
[im 50/84  brain]
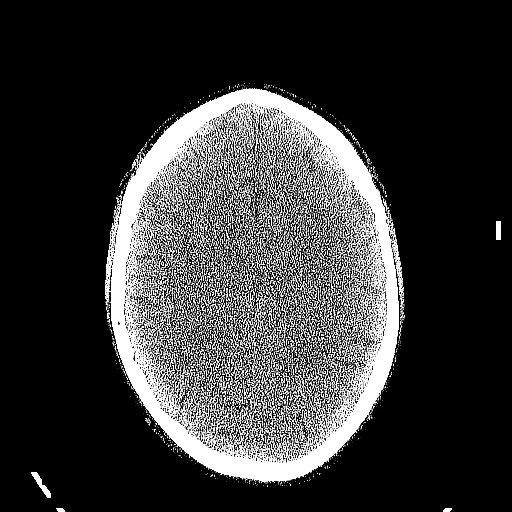
[im 54/84  brain]
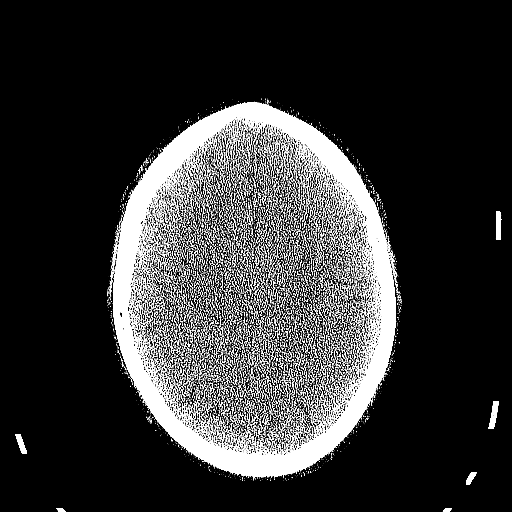
[im 59/84  brain]
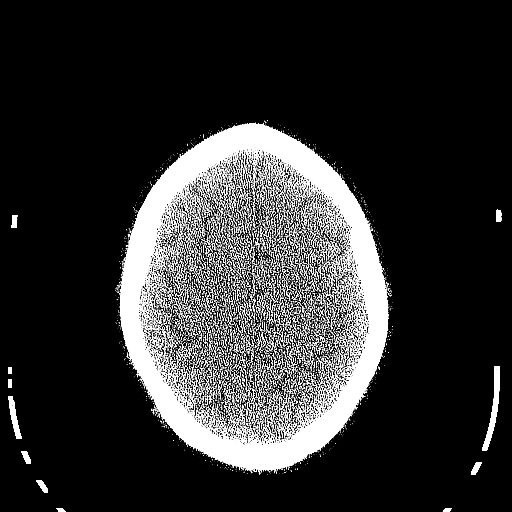
[im 63/84  brain]
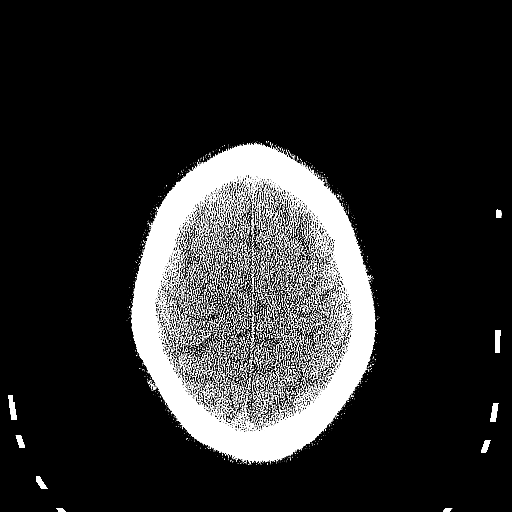
[im 63/84  bone]
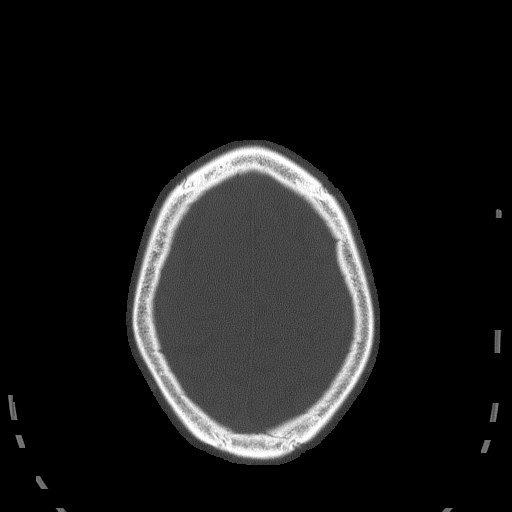
[im 71/84  brain]
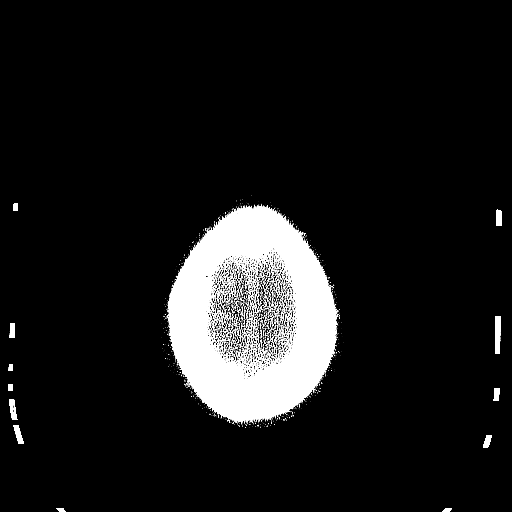
[im 75/84  brain]
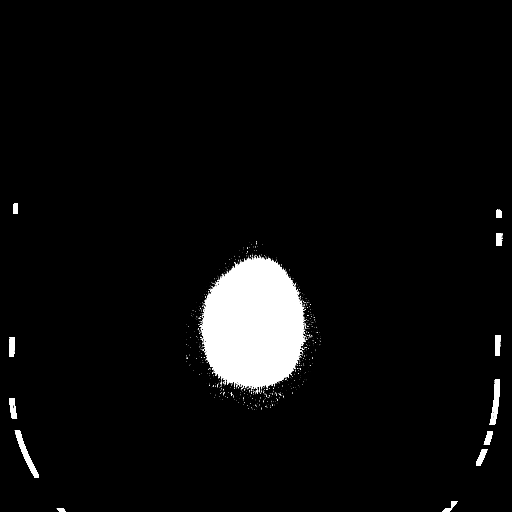
[im 79/84  brain]
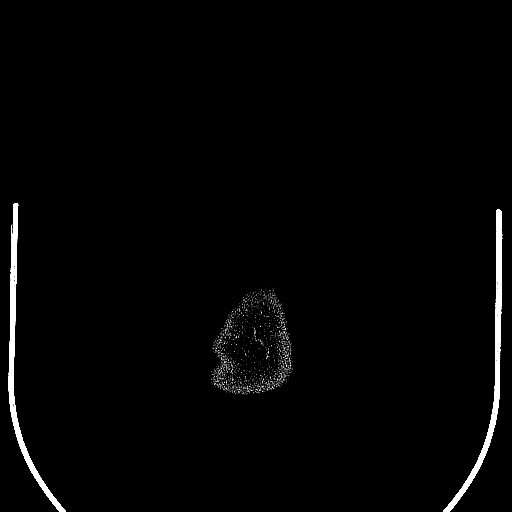

[16 of 30 positions shown; findings below may reference images not displayed]

FINDINGS: Normal appearance of the intracranial structures. No evidence for
acute hemorrhage, mass lesion, midline shift, hydrocephalus or large
infarct. No acute bony abnormality. The visualized sinuses are
clear.
IMPRESSION: No acute intracranial abnormality.

## 2015-04-10 IMAGING — CT CT HEAD W/O CM
1 series · 16 of 30 positions shown, 20 images · non-contrast
Comparison: MR HEAD W/O CM dated 03/13/2013

CLINICAL DATA: TIA, facial droop

EXAM:
CT HEAD WITHOUT CONTRAST
TECHNIQUE: Contiguous axial images were obtained from the base of the skull
through the vertex without intravenous contrast.

[Series 2: head 5.0 h30s · axial · 0.47mm/px · z∈[-81,+54]mm · 16 of 30 slices shown, 20 images]
[im 2/30  brain]
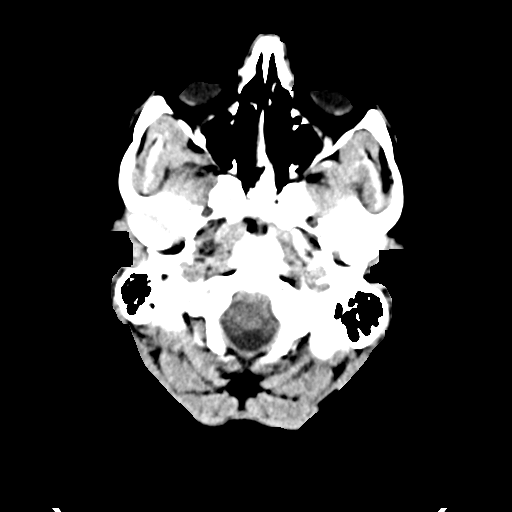
[im 2/30  bone]
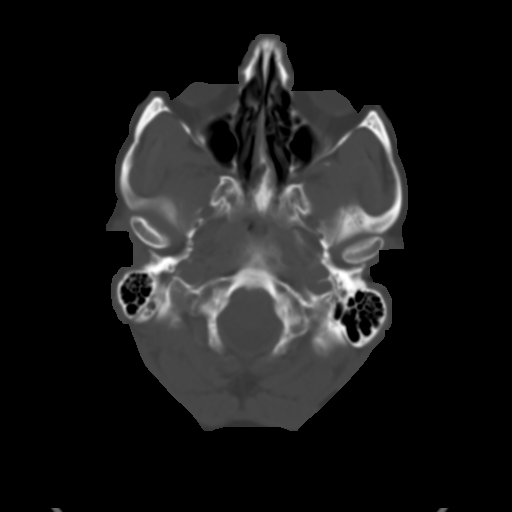
[im 4/30  brain]
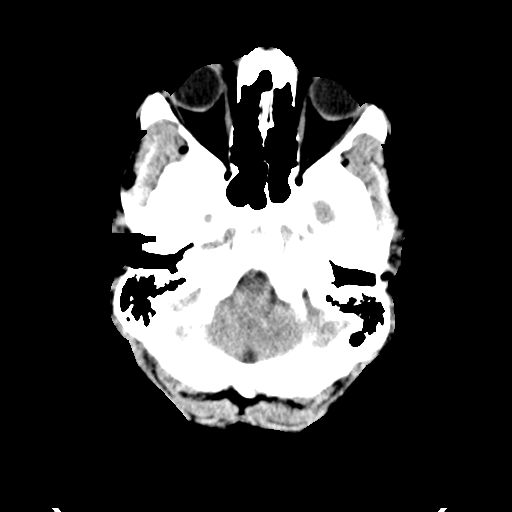
[im 6/30  brain]
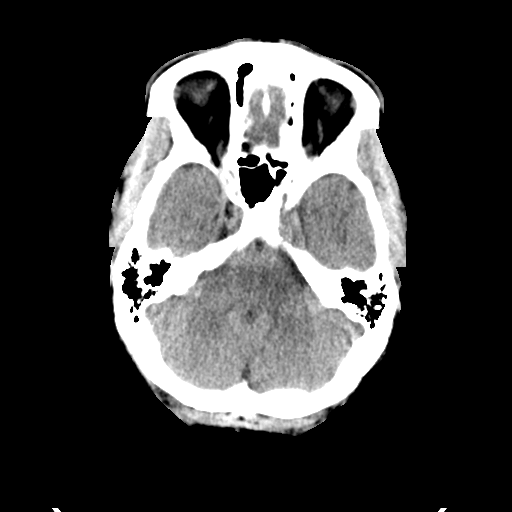
[im 8/30  brain]
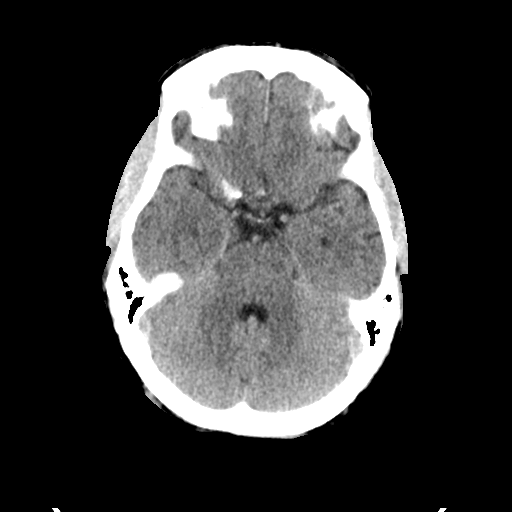
[im 9/30  brain]
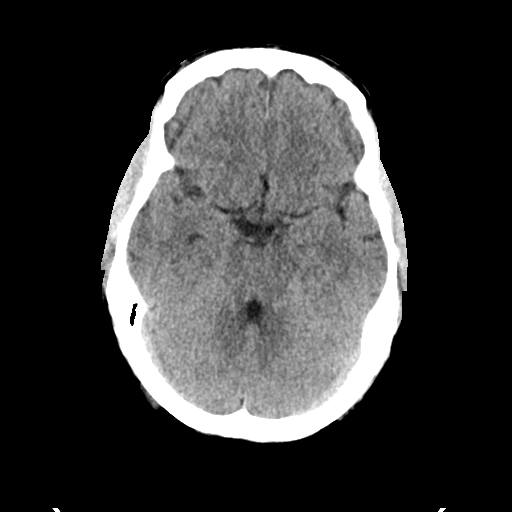
[im 9/30  bone]
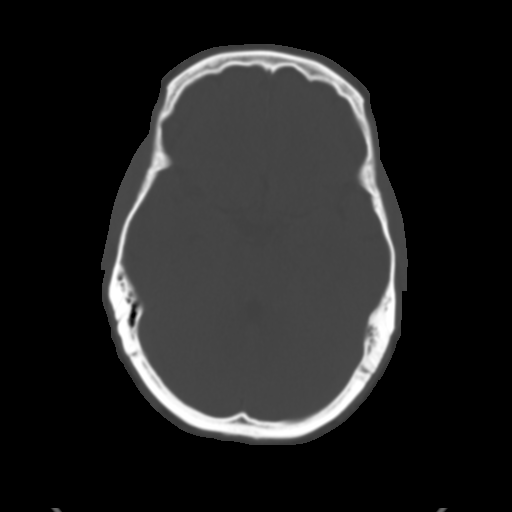
[im 11/30  brain]
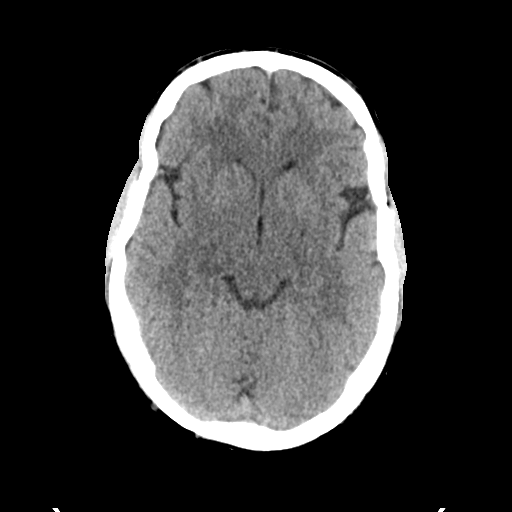
[im 13/30  brain]
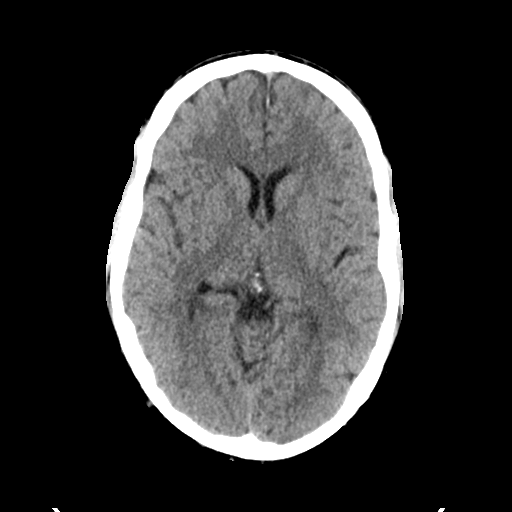
[im 15/30  brain]
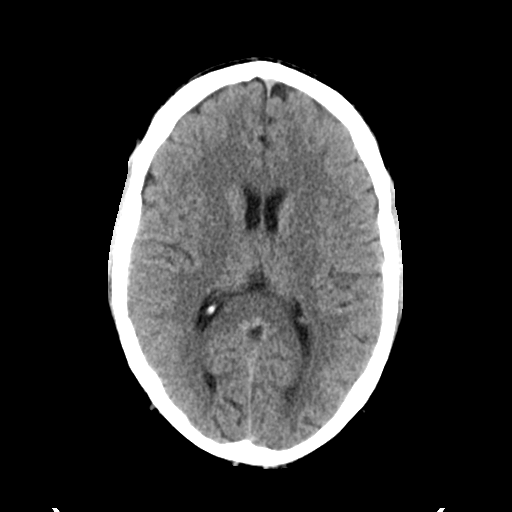
[im 16/30  brain]
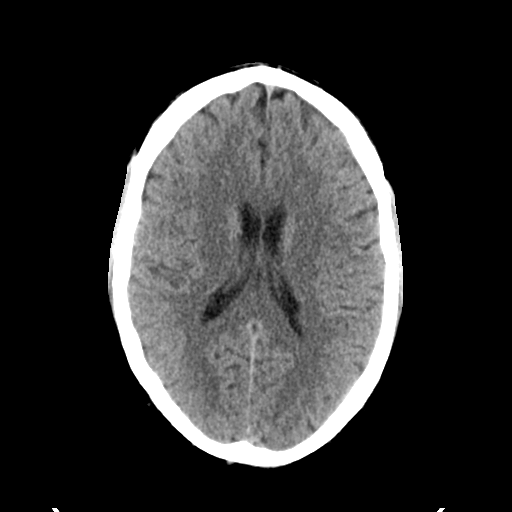
[im 16/30  bone]
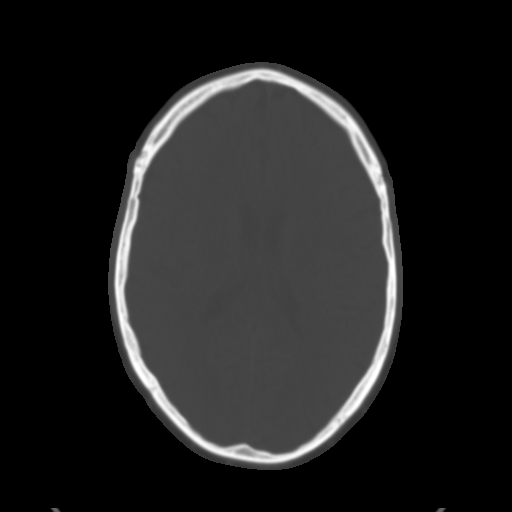
[im 18/30  brain]
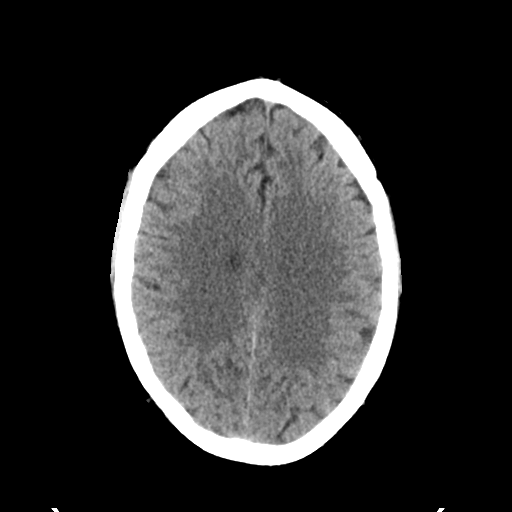
[im 20/30  brain]
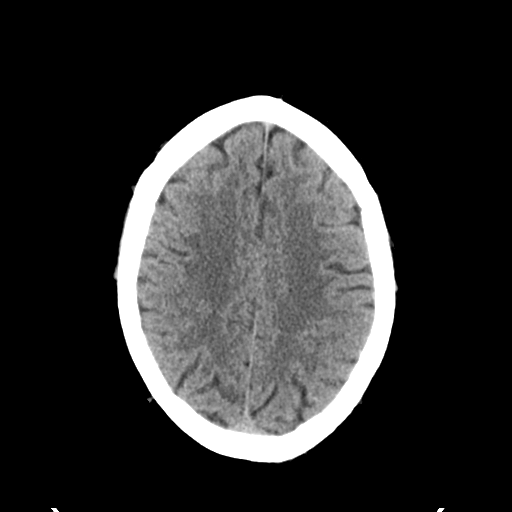
[im 22/30  brain]
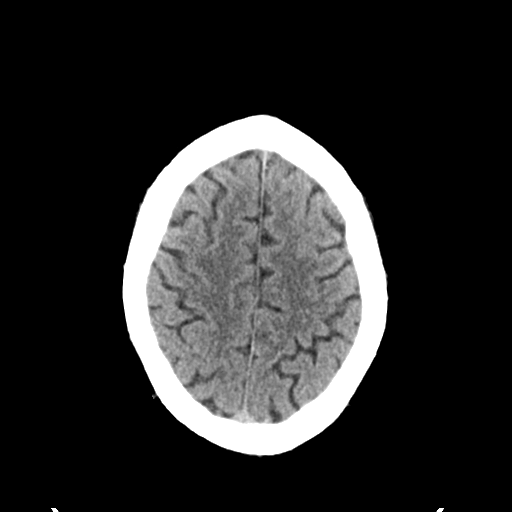
[im 23/30  brain]
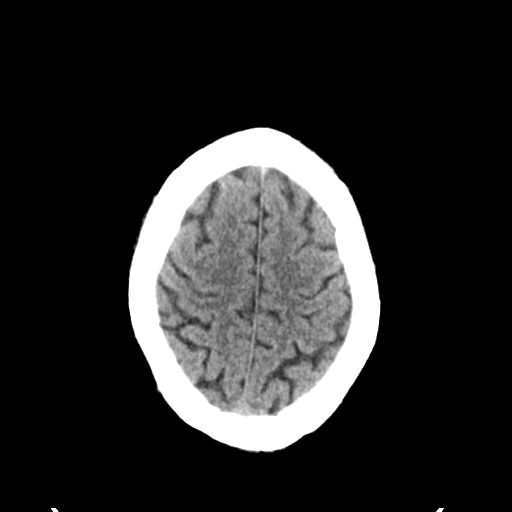
[im 23/30  bone]
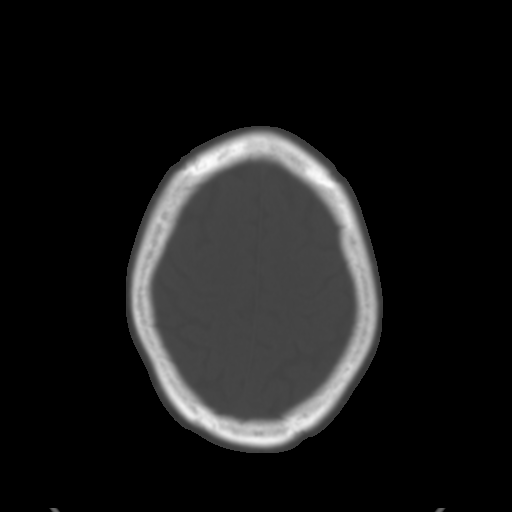
[im 25/30  brain]
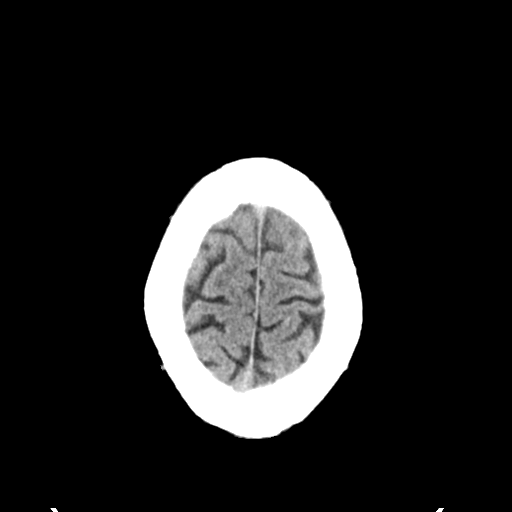
[im 27/30  brain]
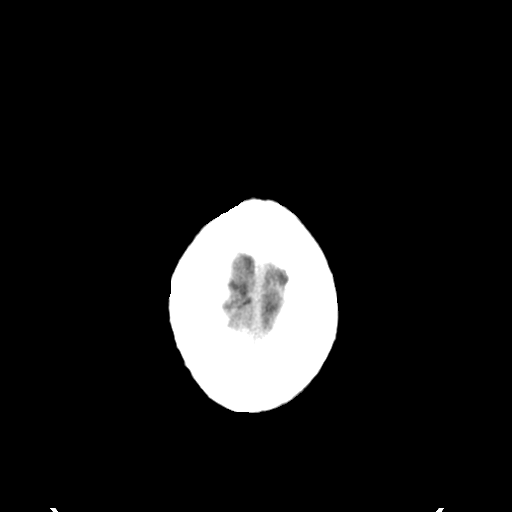
[im 29/30  brain]
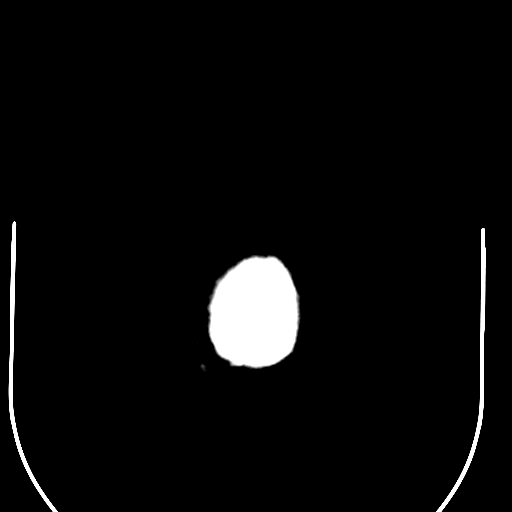

[16 of 30 positions shown; findings below may reference images not displayed]

FINDINGS: There is no evidence of mass effect, midline shift or extra-axial
fluid collections. There is no evidence of a space-occupying lesion
or intracranial hemorrhage. There is no evidence of a cortical-based
area of acute infarction.

The ventricles and sulci are appropriate for the patient's age. The
basal cisterns are patent.

Visualized portions of the orbits are unremarkable. The visualized
portions of the paranasal sinuses and mastoid air cells are
unremarkable.

The osseous structures are unremarkable.
IMPRESSION: No acute intracranial pathology.
